# Patient Record
Sex: Male | Born: 1975 | Race: Black or African American | Hispanic: No | Marital: Married | Smoking: Current every day smoker
Health system: Southern US, Community
[De-identification: ages and names within clinical notes are randomized; demographics above are authoritative.]

## PROBLEM LIST (undated history)

## (undated) ENCOUNTER — Emergency Department: Discharge status: Absent without leave | Payer: Self-pay | Source: Home / Self Care

---

## 2007-06-17 ENCOUNTER — Other Ambulatory Visit: Payer: Self-pay

## 2007-06-17 ENCOUNTER — Emergency Department: Payer: Self-pay | Admitting: Emergency Medicine

## 2007-06-17 IMAGING — CT CT CHEST W/ CM
1 of 2 series · 14 of 32 positions shown, 18 images · IV contrast (APPLIED)
Comparison: none

REASON FOR EXAM: PLEURITIC RIGHT CP, ELEVATED DDIMER
COMMENTS:

[Series 5: lung windows · axial · 0.87mm/px · z∈[+206,+503]mm · 14 of 118 slices shown, 18 images]
[im 10/118  mediastinal]
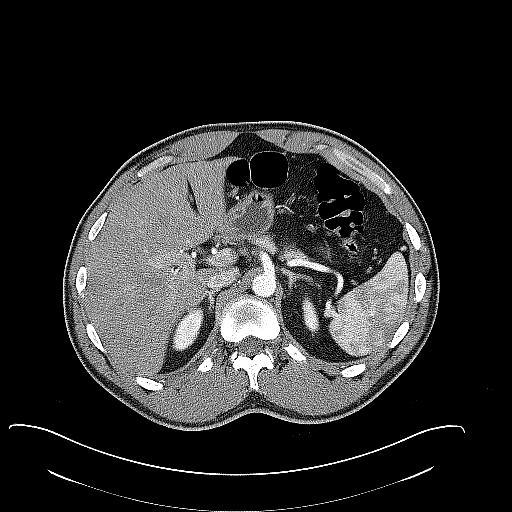
[im 10/118  lung]
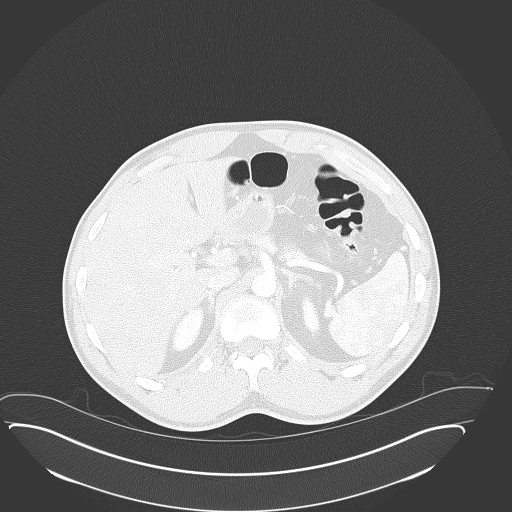
[im 19/118  lung]
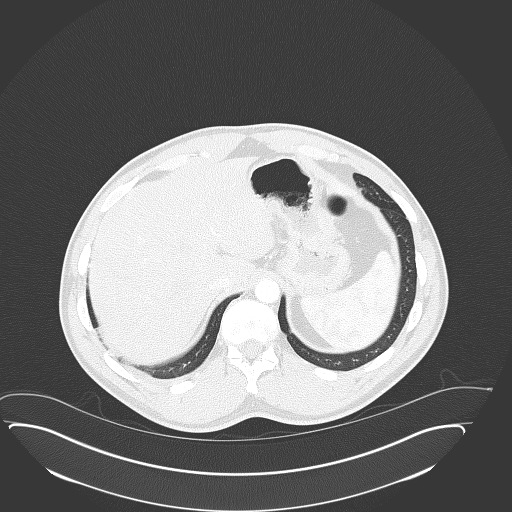
[im 28/118  lung]
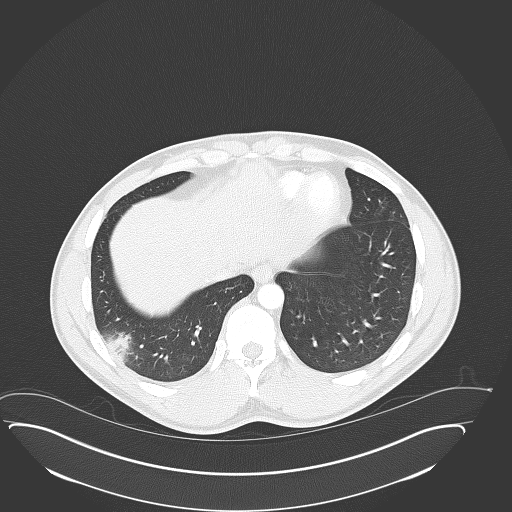
[im 37/118  lung]
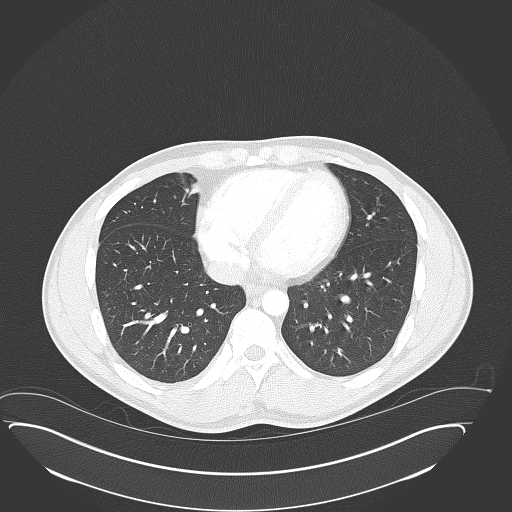
[im 46/118  mediastinal]
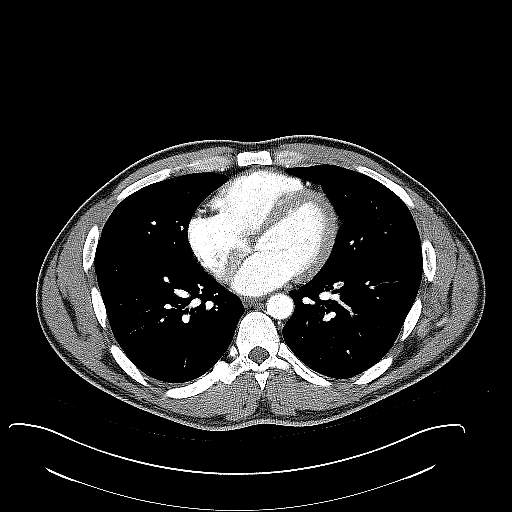
[im 46/118  lung]
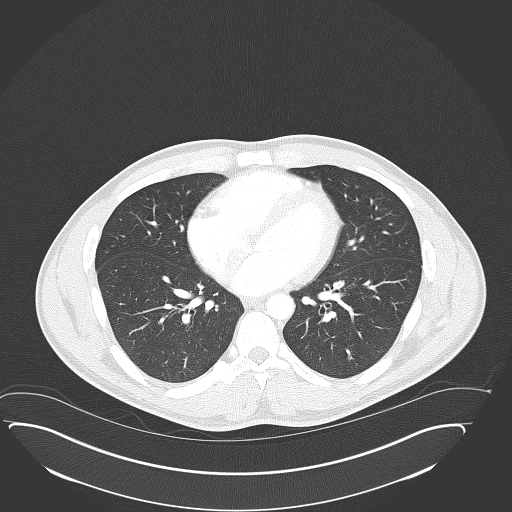
[im 55/118  lung]
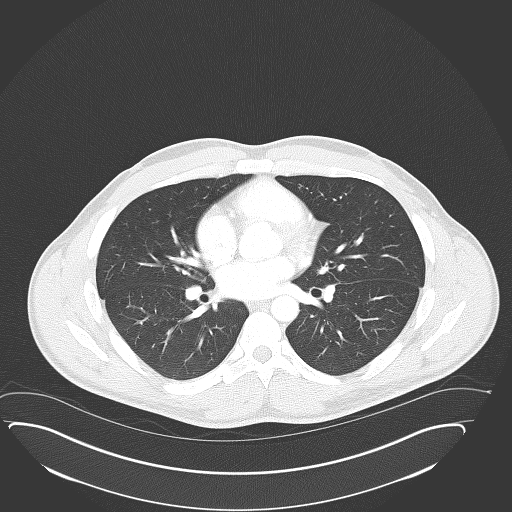
[im 56/118  lung]
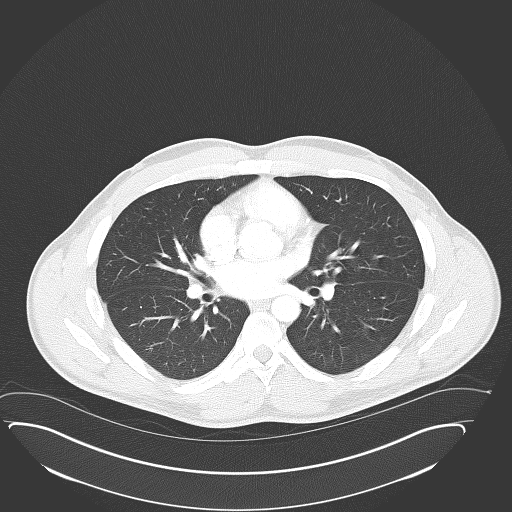
[im 59/118  lung]
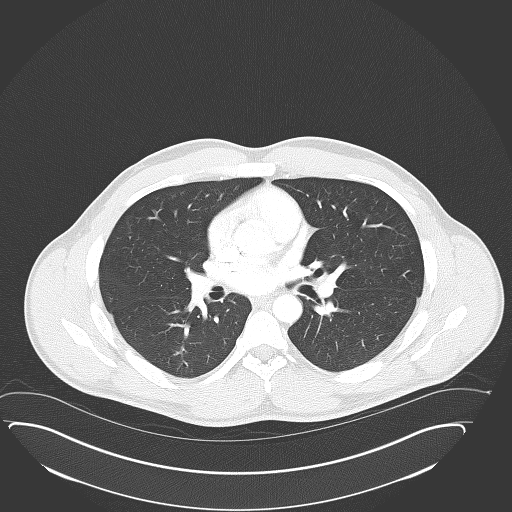
[im 64/118  mediastinal]
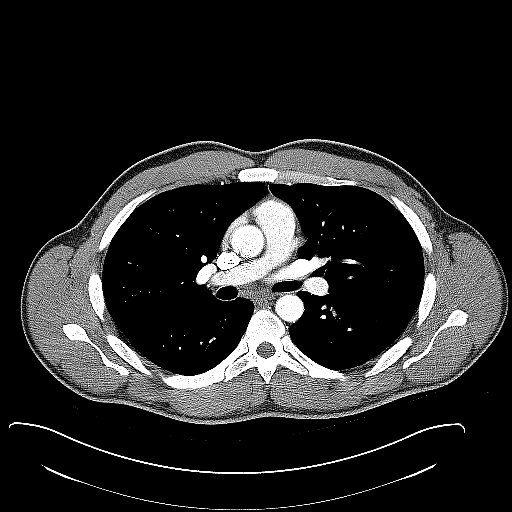
[im 64/118  lung]
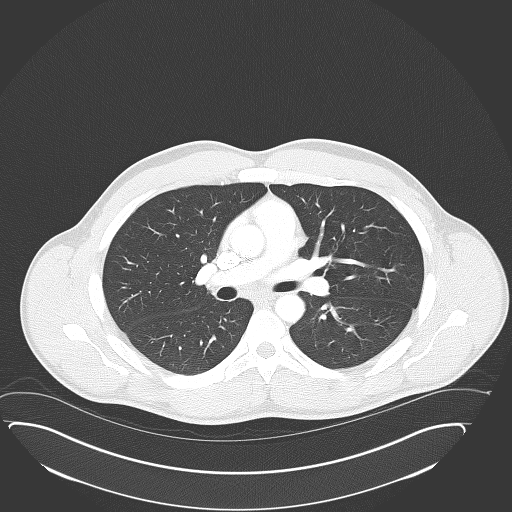
[im 73/118  lung]
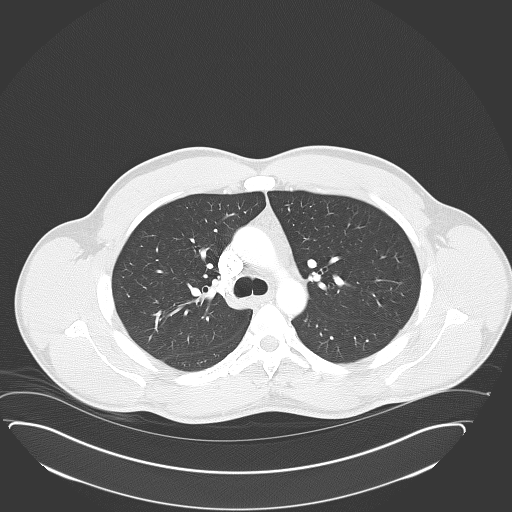
[im 82/118  lung]
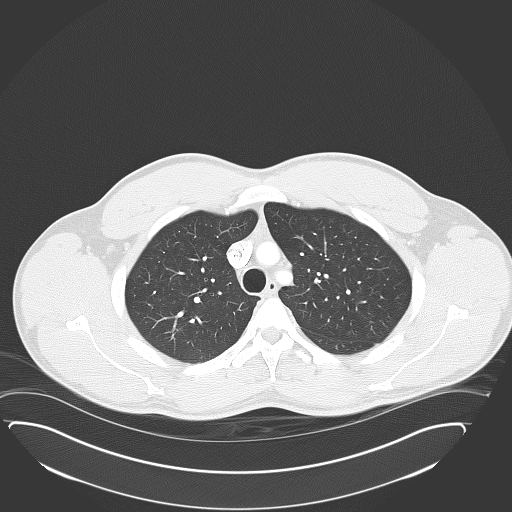
[im 91/118  lung]
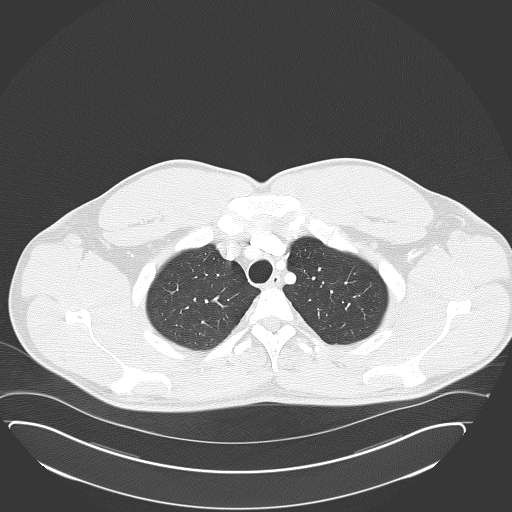
[im 100/118  mediastinal]
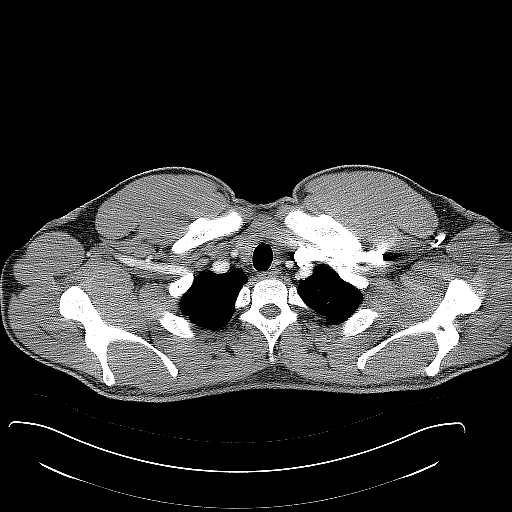
[im 100/118  lung]
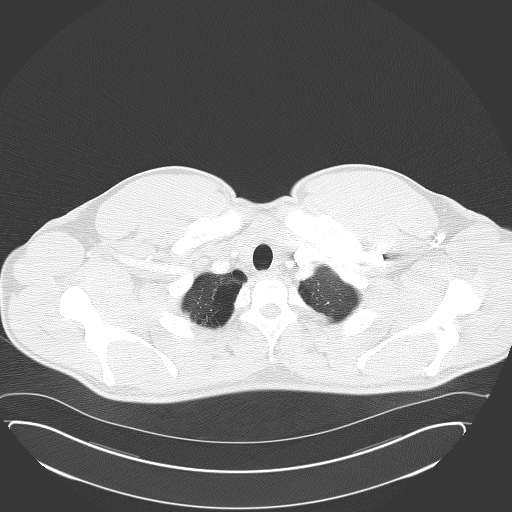
[im 109/118  lung]
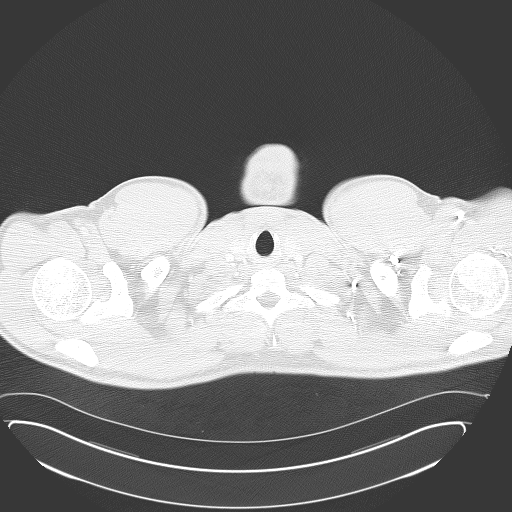

[14 of 32 positions shown; findings below may reference images not displayed]

PROCEDURE:     CT  - CT CHEST (FOR PE) W  - [DATE]  [DATE]

RESULT:     Helical 3 mm sections were obtained from the thoracic inlet
through the lung bases status post intravenous administration of 85 ml of
[KV].

Evaluation of the mediastinum, hilar regions and structures demonstrates no
evidence of mediastinal or hilar adenopathy or masses. There is no evidence
of filling defects within the main, lobar or segmental pulmonary arteries.
A very mild area of patchy increased density projects within the RIGHT lung
base. The lung parenchyma is otherwise unremarkable.  The visualized upper
abdominal viscera demonstrate no gross abnormalities.
IMPRESSION: 1.     No CT evidence of pulmonary arterial embolic disease.
2.     Atelectasis versus infiltrate RIGHT lung base.  Monitoring is
recommended status post appropriate therapeutic regimen.  If this area
persists, repeat evaluation with CT is recommended.
3.     Dr. JAYALEXIS of the Emergency Department was informed of these
findings at the time of the initial interpretation.

## 2007-06-17 IMAGING — CR DG CHEST 2V
1 series · 3 of 3 positions shown · non-contrast
Comparison: none

REASON FOR EXAM: CP
COMMENTS:

PROCEDURE:     DXR - DXR CHEST PA (OR AP) AND LATERAL  - [DATE]  [DATE]
RESULT:     The lungs are clear.  The cardiac silhouette and visualized bony
skeleton are unremarkable.

[Series 1: view not recorded · 0.17mm/px · 3 of 3 slices shown]
[im 1/3]
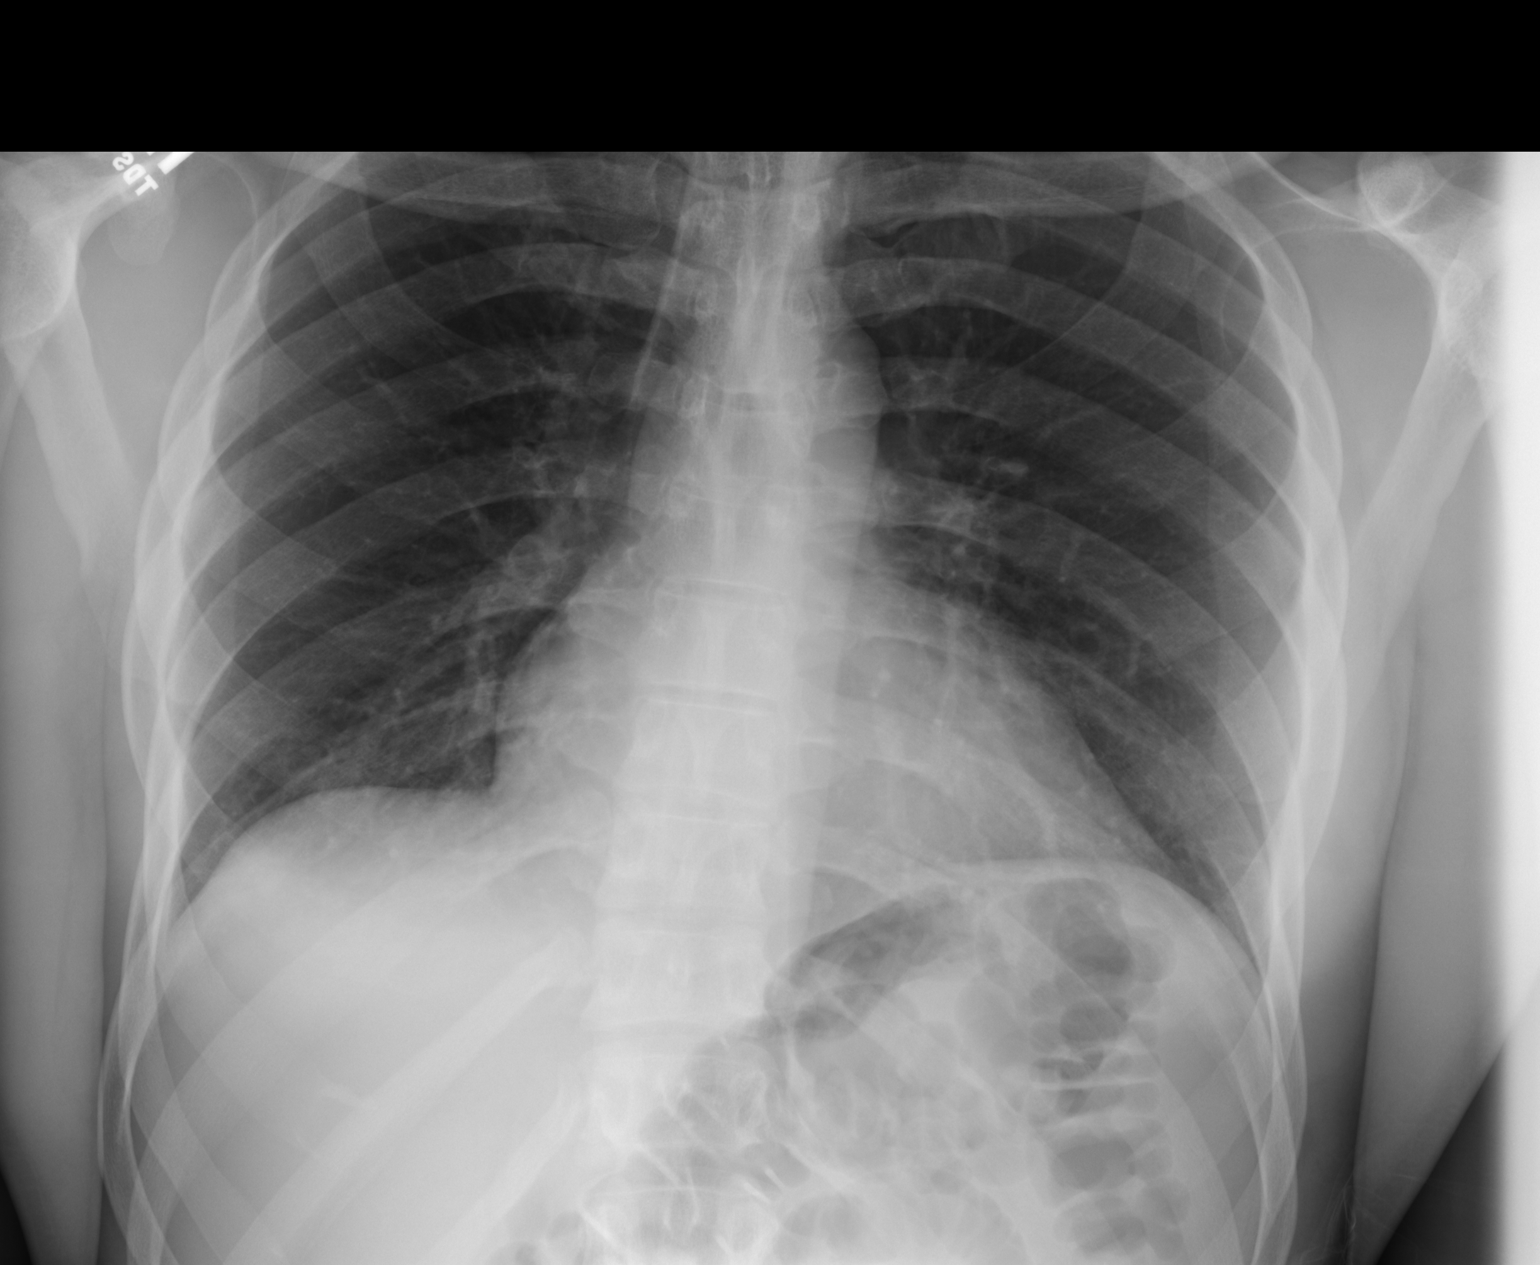
[im 2/3]
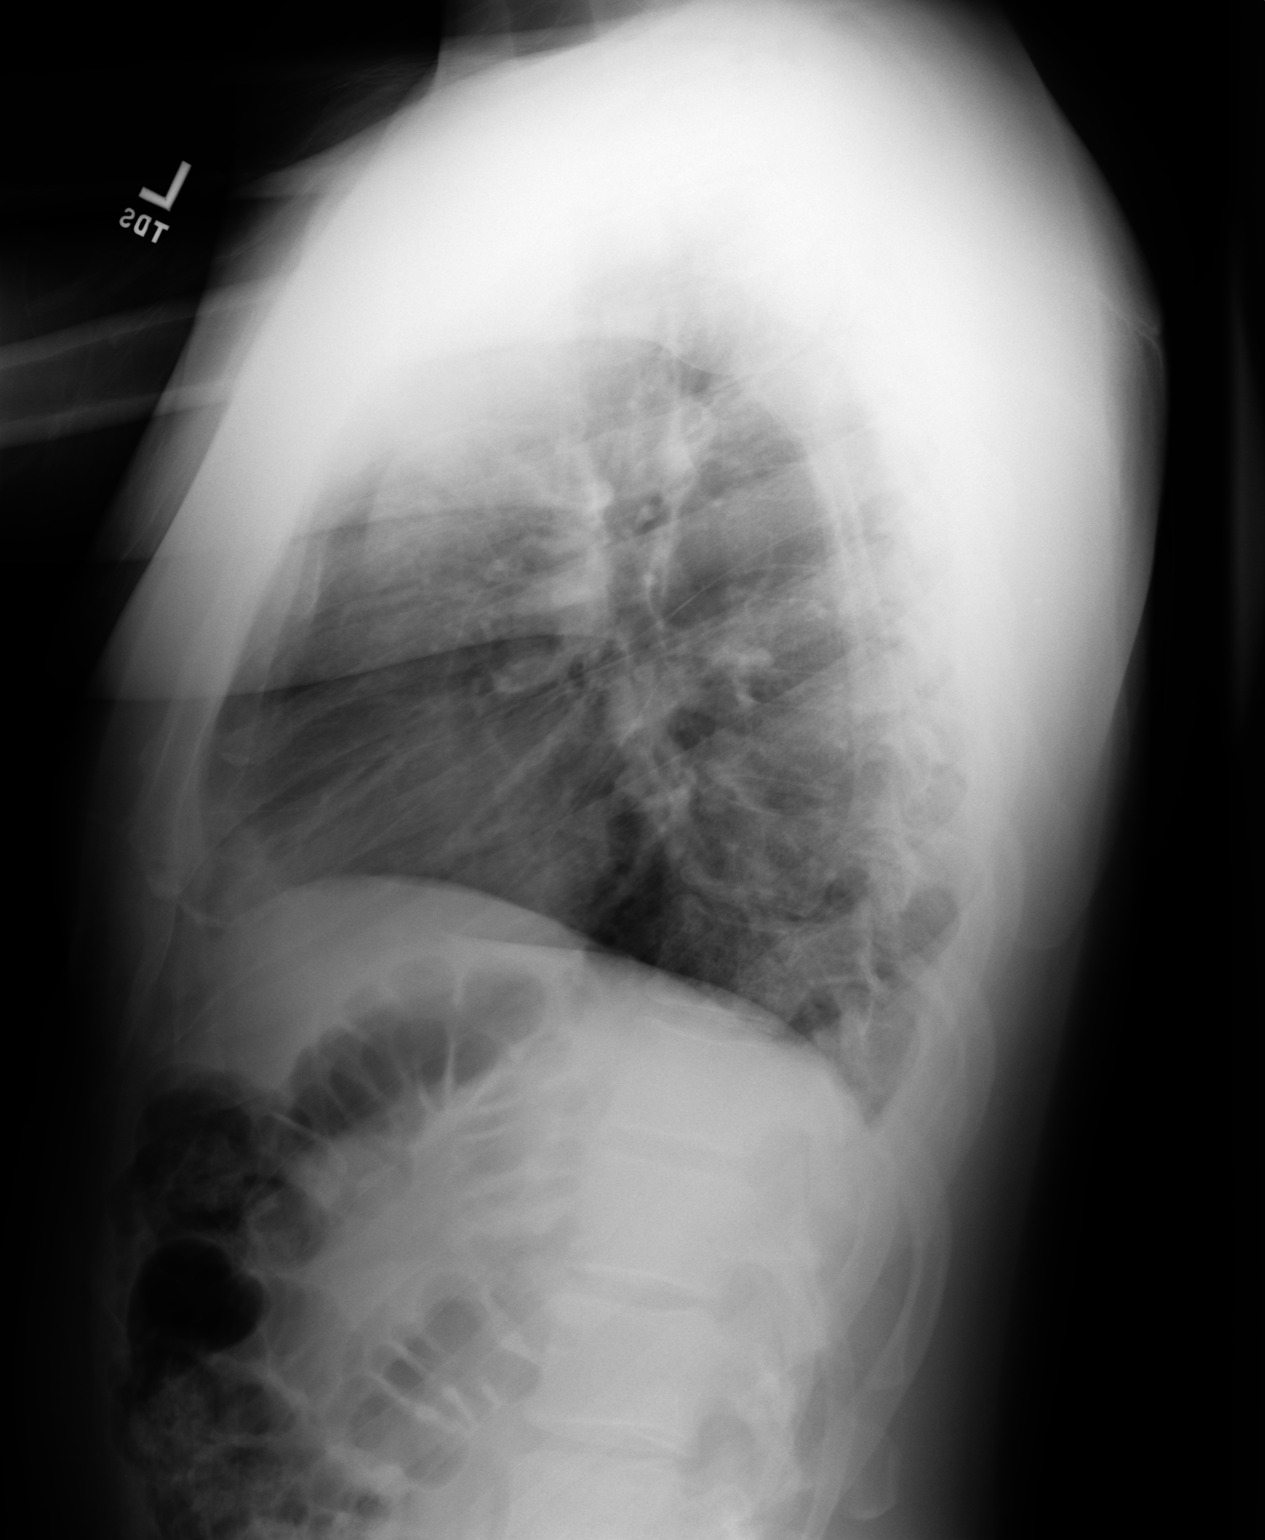
[im 3/3]
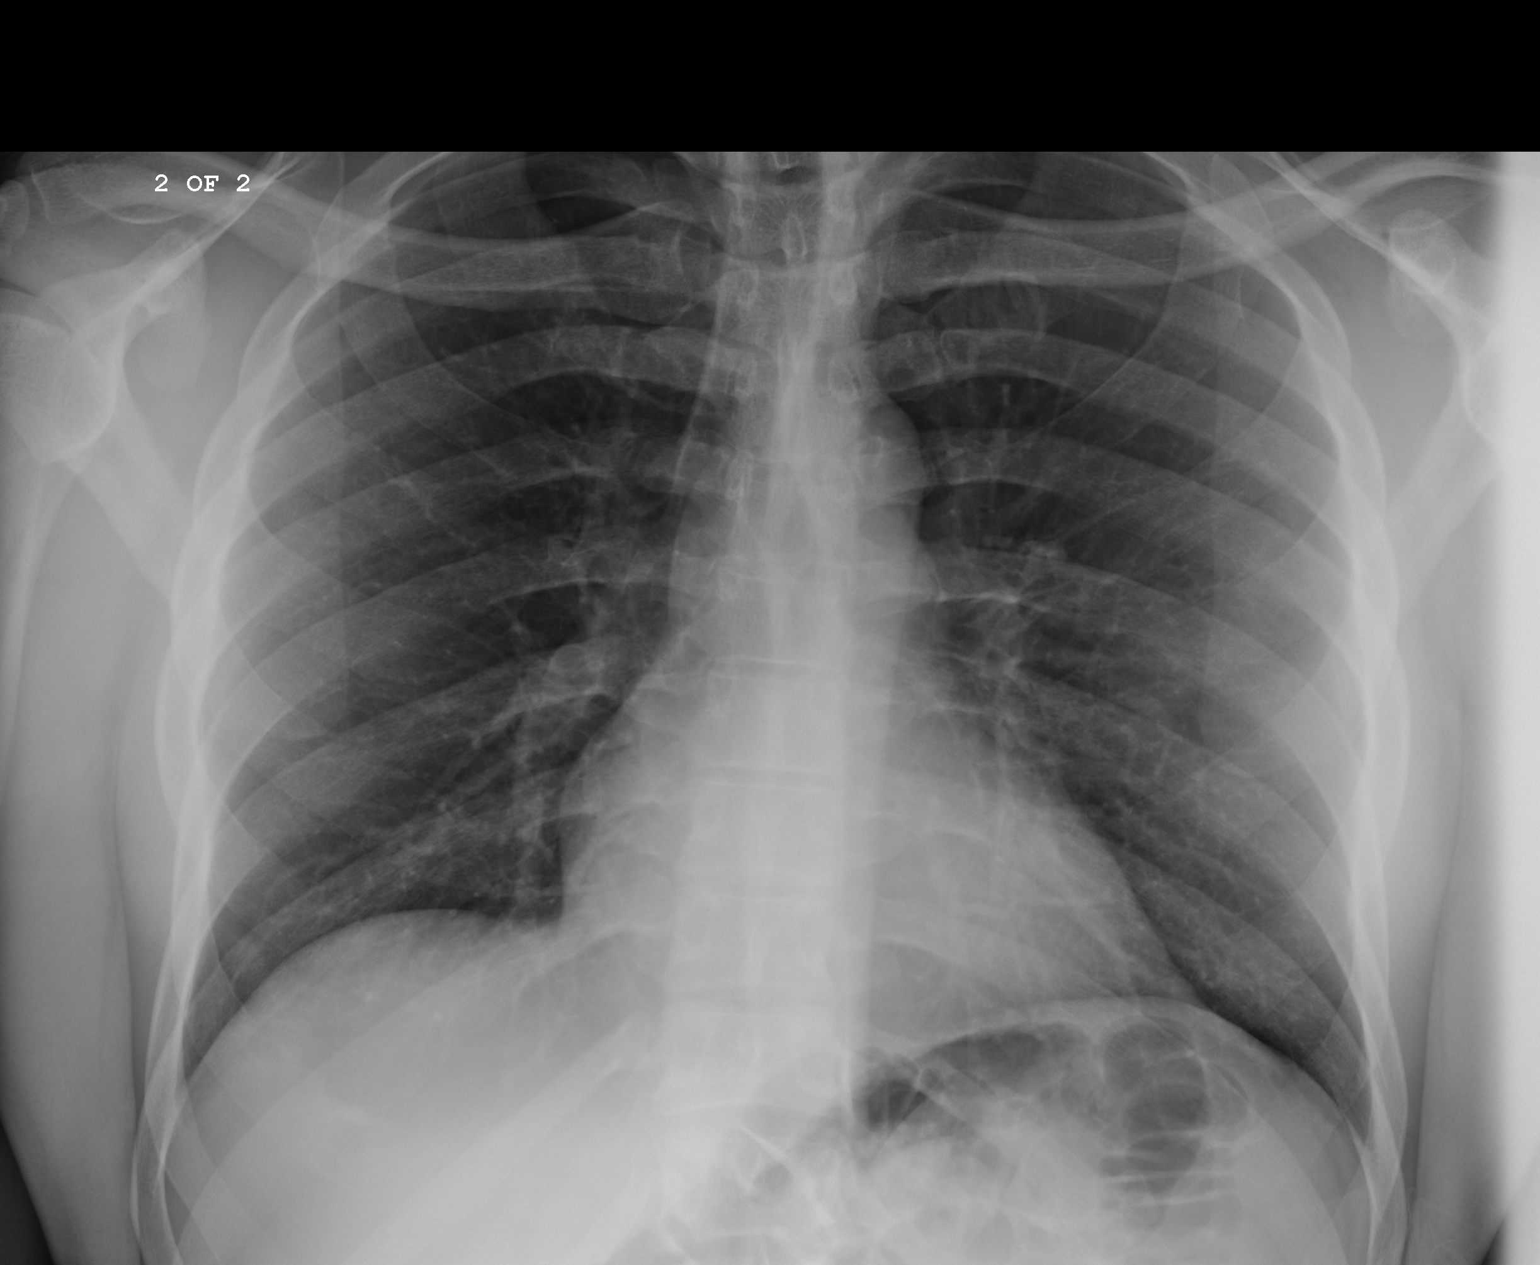

[3 of 3 positions shown; findings below may reference images not displayed]

IMPRESSION: Chest radiograph without evidence of acute cardiopulmonary disease.

## 2021-04-15 DIAGNOSIS — R0689 Other abnormalities of breathing: Secondary | ICD-10-CM | POA: Diagnosis not present

## 2021-04-15 DIAGNOSIS — T50904A Poisoning by unspecified drugs, medicaments and biological substances, undetermined, initial encounter: Secondary | ICD-10-CM | POA: Diagnosis not present

## 2021-04-15 DIAGNOSIS — T887XXA Unspecified adverse effect of drug or medicament, initial encounter: Secondary | ICD-10-CM | POA: Diagnosis not present

## 2021-04-15 DIAGNOSIS — R4020 Unspecified coma: Secondary | ICD-10-CM | POA: Diagnosis not present

## 2021-04-15 DIAGNOSIS — R404 Transient alteration of awareness: Secondary | ICD-10-CM | POA: Diagnosis not present

## 2021-10-31 ENCOUNTER — Emergency Department: Payer: 59

## 2021-10-31 ENCOUNTER — Other Ambulatory Visit: Payer: Self-pay

## 2021-10-31 ENCOUNTER — Emergency Department
Admission: EM | Admit: 2021-10-31 | Discharge: 2021-10-31 | Disposition: A | Discharge status: Home / Self Care | Payer: 59 | Attending: Emergency Medicine | Admitting: Emergency Medicine

## 2021-10-31 DIAGNOSIS — I639 Cerebral infarction, unspecified: Secondary | ICD-10-CM | POA: Diagnosis not present

## 2021-10-31 DIAGNOSIS — G43409 Hemiplegic migraine, not intractable, without status migrainosus: Secondary | ICD-10-CM | POA: Diagnosis not present

## 2021-10-31 DIAGNOSIS — G43809 Other migraine, not intractable, without status migrainosus: Secondary | ICD-10-CM | POA: Diagnosis not present

## 2021-10-31 DIAGNOSIS — J45909 Unspecified asthma, uncomplicated: Secondary | ICD-10-CM | POA: Insufficient documentation

## 2021-10-31 DIAGNOSIS — Y900 Blood alcohol level of less than 20 mg/100 ml: Secondary | ICD-10-CM | POA: Diagnosis not present

## 2021-10-31 DIAGNOSIS — I10 Essential (primary) hypertension: Secondary | ICD-10-CM | POA: Diagnosis not present

## 2021-10-31 DIAGNOSIS — R0689 Other abnormalities of breathing: Secondary | ICD-10-CM | POA: Diagnosis not present

## 2021-10-31 DIAGNOSIS — Z743 Need for continuous supervision: Secondary | ICD-10-CM | POA: Diagnosis not present

## 2021-10-31 DIAGNOSIS — R531 Weakness: Secondary | ICD-10-CM | POA: Diagnosis not present

## 2021-10-31 DIAGNOSIS — R69 Illness, unspecified: Secondary | ICD-10-CM | POA: Diagnosis not present

## 2021-10-31 DIAGNOSIS — R55 Syncope and collapse: Secondary | ICD-10-CM | POA: Diagnosis not present

## 2021-10-31 DIAGNOSIS — R29818 Other symptoms and signs involving the nervous system: Secondary | ICD-10-CM | POA: Diagnosis not present

## 2021-10-31 DIAGNOSIS — R4020 Unspecified coma: Secondary | ICD-10-CM | POA: Diagnosis not present

## 2021-10-31 LAB — DIFFERENTIAL
Abs Immature Granulocytes: 0.03 10*3/uL (ref 0.00–0.07)
Basophils Absolute: 0 10*3/uL (ref 0.0–0.1)
Basophils Relative: 1 %
Eosinophils Absolute: 0.1 10*3/uL (ref 0.0–0.5)
Eosinophils Relative: 1 %
Immature Granulocytes: 0 %
Lymphocytes Relative: 23 %
Lymphs Abs: 2 10*3/uL (ref 0.7–4.0)
Monocytes Absolute: 0.5 10*3/uL (ref 0.1–1.0)
Monocytes Relative: 6 %
Neutro Abs: 6 10*3/uL (ref 1.7–7.7)
Neutrophils Relative %: 69 %

## 2021-10-31 LAB — COMPREHENSIVE METABOLIC PANEL
ALT: 13 U/L (ref 0–44)
AST: 13 U/L — ABNORMAL LOW (ref 15–41)
Albumin: 3.6 g/dL (ref 3.5–5.0)
Alkaline Phosphatase: 63 U/L (ref 38–126)
Anion gap: 5 (ref 5–15)
BUN: 13 mg/dL (ref 6–20)
CO2: 26 mmol/L (ref 22–32)
Calcium: 8.7 mg/dL — ABNORMAL LOW (ref 8.9–10.3)
Chloride: 111 mmol/L (ref 98–111)
Creatinine, Ser: 0.98 mg/dL (ref 0.61–1.24)
GFR, Estimated: >60 mL/min (ref >60)
Glucose, Bld: 92 mg/dL (ref 70–99)
Potassium: 3.9 mmol/L (ref 3.5–5.1)
Sodium: 142 mmol/L (ref 135–145)
Total Bilirubin: 0.7 mg/dL (ref 0.3–1.2)
Total Protein: 6.4 g/dL — ABNORMAL LOW (ref 6.5–8.1)

## 2021-10-31 LAB — PROTIME-INR
INR: 1 (ref 0.8–1.2)
Prothrombin Time: 12.9 seconds (ref 11.4–15.2)

## 2021-10-31 LAB — ETHANOL: Alcohol, Ethyl (B): <10 mg/dL (ref <10)

## 2021-10-31 LAB — CBC
HCT: 45.3 % (ref 39.0–52.0)
Hemoglobin: 14.3 g/dL (ref 13.0–17.0)
MCH: 28.1 pg (ref 26.0–34.0)
MCHC: 31.6 g/dL (ref 30.0–36.0)
MCV: 89.2 fL (ref 80.0–100.0)
Platelets: 215 10*3/uL (ref 150–400)
RBC: 5.08 MIL/uL (ref 4.22–5.81)
RDW: 13.8 % (ref 11.5–15.5)
WBC: 8.7 10*3/uL (ref 4.0–10.5)
nRBC: 0 % (ref 0.0–0.2)

## 2021-10-31 LAB — APTT: aPTT: 32 seconds (ref 24–36)

## 2021-10-31 LAB — CBG MONITORING, ED: Glucose-Capillary: 87 mg/dL (ref 70–99)

## 2021-10-31 IMAGING — CT CT HEAD CODE STROKE
3 of 4 series · 13 of 47 positions shown, 15 images · non-contrast
Comparison: No pertinent prior exams available for comparison.

CLINICAL DATA: Code stroke. Provided history: Neuro deficit, acute,
stroke suspected.

EXAM:
CT ANGIOGRAPHY HEAD AND NECK
TECHNIQUE: Multidetector CT imaging of the head and neck was performed using
the standard protocol during bolus administration of intravenous
contrast. Multiplanar CT image reconstructions and MIPs were
obtained to evaluate the vascular anatomy. Carotid stenosis
measurements (when applicable) are obtained utilizing NASCET
criteria, using the distal internal carotid diameter as the
denominator.

[Series 4: head wo · axial · 0.45mm/px · z∈[-109,+11]mm · 7 of 32 slices shown, 9 images]
[im 4/32  brain]
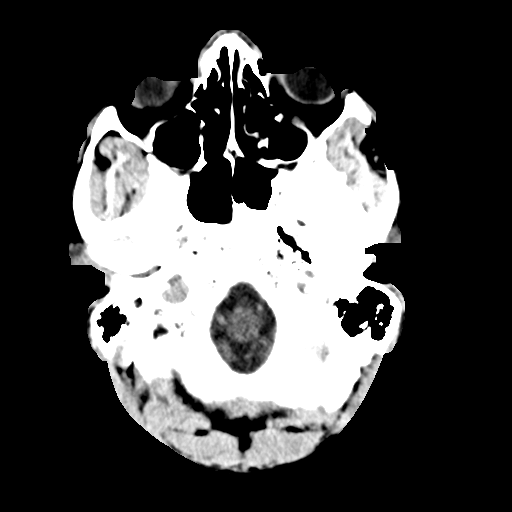
[im 4/32  bone]
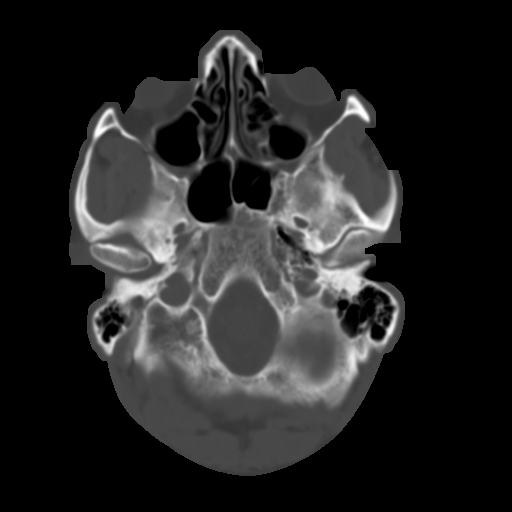
[im 8/32  brain]
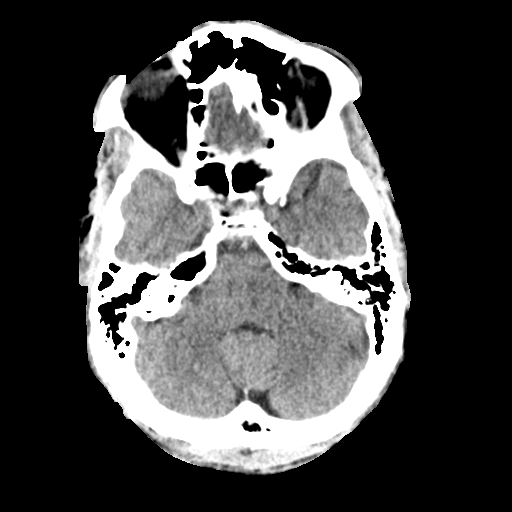
[im 12/32  brain]
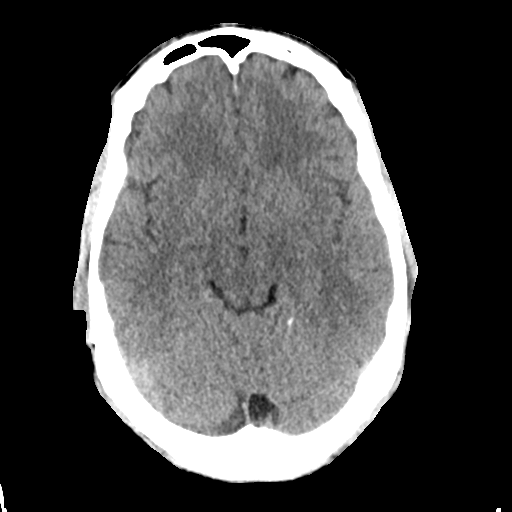
[im 16/32  brain]
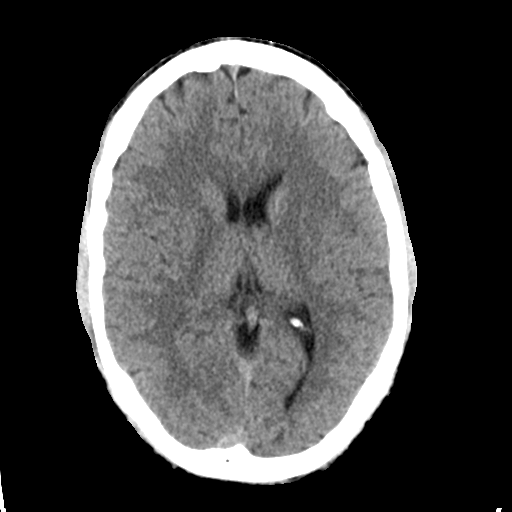
[im 20/32  brain]
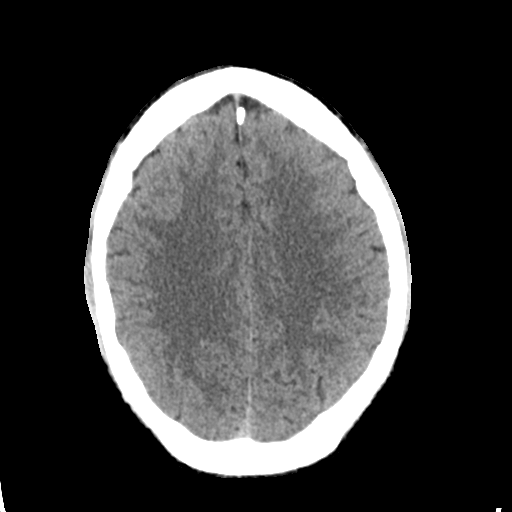
[im 20/32  bone]
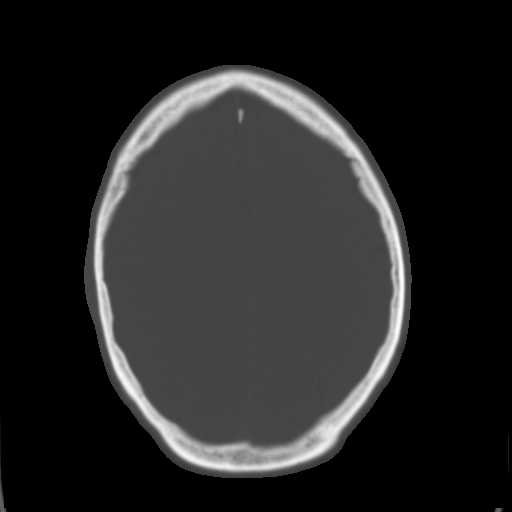
[im 24/32  brain]
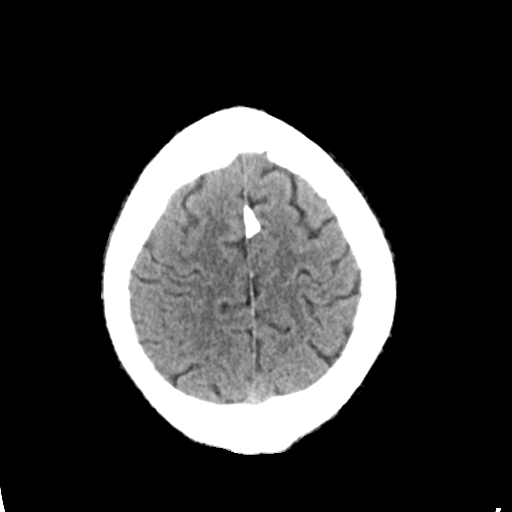
[im 28/32  brain]
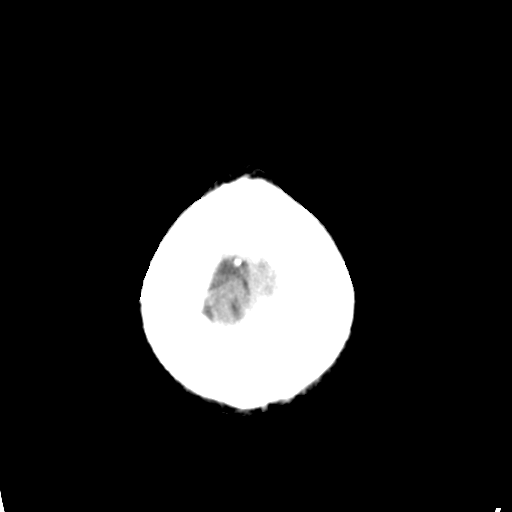

[Series 6: coronal soft tissue · coronal · 0.32mm/px · 3 of 73 slices shown]
[im 25/73  brain]
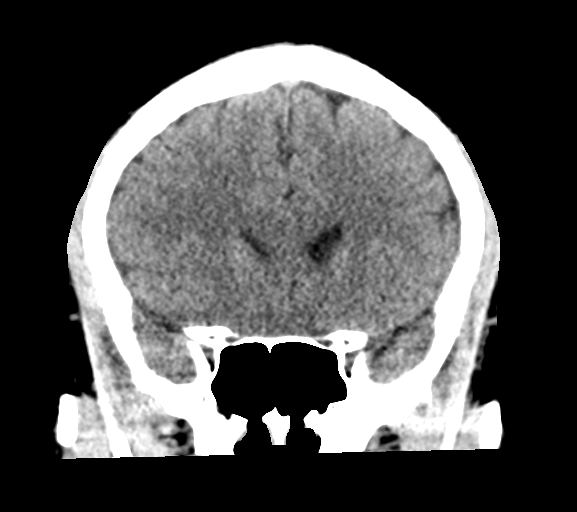
[im 33/73  brain]
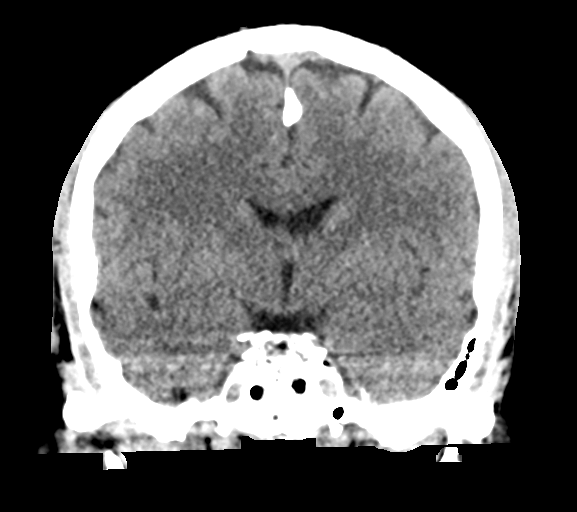
[im 41/73  brain]
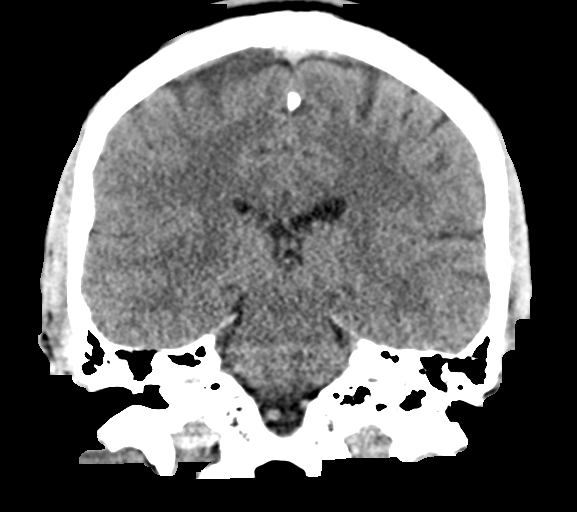

[Series 7: sagittal soft tissue · sagittal · 0.32mm/px · 3 of 63 slices shown]
[im 21/63  brain]
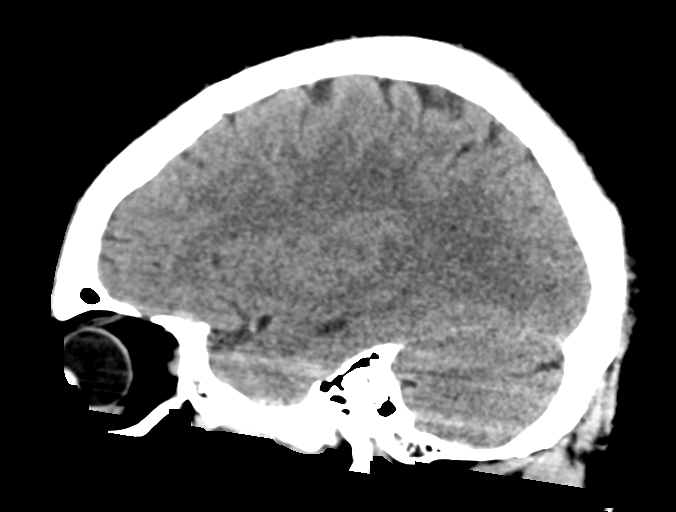
[im 32/63  brain]
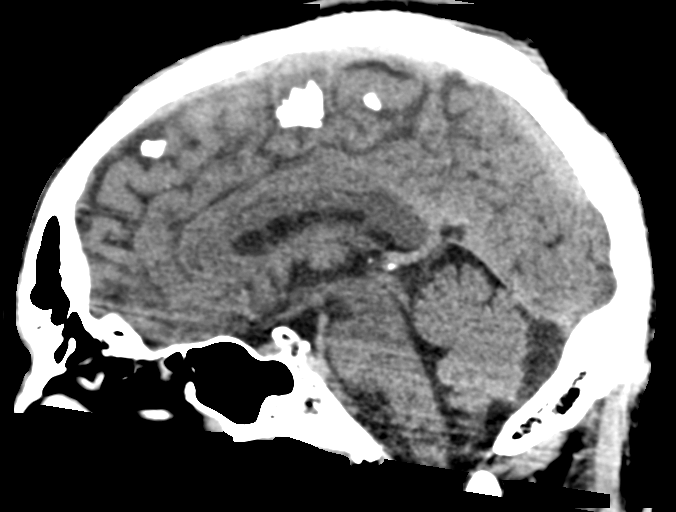
[im 42/63  brain]
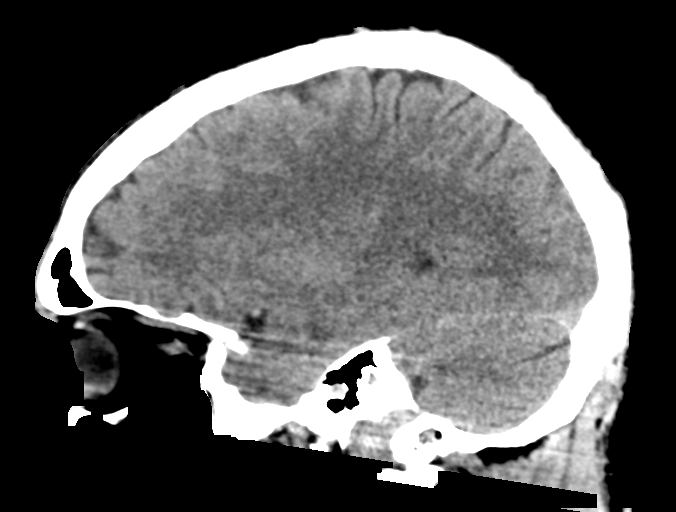

[13 of 47 positions shown; findings below may reference images not displayed]

RADIATION DOSE REDUCTION: This exam was performed according to the
departmental dose-optimization program which includes automated
exposure control, adjustment of the mA and/or kV according to
patient size and/or use of iterative reconstruction technique.

CONTRAST:  Administered contrast not known at this time.
FINDINGS: CT HEAD FINDINGS

Brain:

Cerebral volume is normal.

There is no acute intracranial hemorrhage.

No demarcated cortical infarct.

No extra-axial fluid collection.

No evidence of an intracranial mass.

No midline shift.

Vascular: No hyperdense vessel.

Skull: No fracture or aggressive osseous lesion.

Sinuses/Orbits: No mass or acute finding within the imaged orbits.
Trace mucosal thickening within the bilateral maxillary sinuses at
the imaged levels.

ASPECTS (Alberta Stroke Program Early CT Score)

- Ganglionic level infarction (caudate, lentiform nuclei, internal
capsule, insula, M1-M3 cortex): 7

- Supraganglionic infarction (M4-M6 cortex): 3

Total score (0-10 with 10 being normal): 10

These results were communicated to Dr. STUMAN at [DATE] STUMAN
[DATE]by text page via the [employer] messaging system.

Review of the MIP images confirms the above findings

CTA NECK FINDINGS

Suboptimal arterial contrast bolus/timing.

Aortic arch: Common origin of the innominate and left common carotid
arteries. Streak and beam hardening artifact arising from a dense
right-sided contrast bolus partially obscures the right subclavian
artery. Within this limitation, there is no appreciable
hemodynamically significant innominate or proximal subclavian artery
stenosis.

Right carotid system: CCA and ICA patent within the neck without
stenosis or significant atherosclerotic disease.

Left carotid system: CCA and ICA patent within the neck without
stenosis or significant atherosclerotic disease.

Vertebral arteries: Vertebral arteries codominant and patent within
the neck without appreciable hemodynamically significant stenosis.

Skeleton: Reversal the expected cervical lordosis. No acute fracture
or aggressive osseous lesion.

Other neck: No neck mass or cervical lymphadenopathy.

Upper chest: No consolidation within the imaged lung apices.
Biapical paraseptal emphysema/blebs.

Review of the MIP images confirms the above findings

CTA HEAD FINDINGS

Examination limited by suboptimal arterial contrast bolus/timing.
Most notably, this limits evaluation at the level of the M2 and more
distal MCA vessels.

Anterior circulation:

The intracranial internal carotid arteries are patent. The M1 middle
cerebral arteries are patent. No definite M2 proximal branch
occlusion or high-grade proximal stenosis. The right anterior
cerebral artery is poorly delineated at the A2 and more distal
levels. This may be due to developmentally small vessel size.
However, vessel occlusion at this site cannot be excluded. No
intracranial aneurysm is identified.

Posterior circulation:

The intracranial vertebral arteries are patent. The basilar artery
is patent. The posterior cerebral arteries are patent. Posterior
communicating arteries are diminutive or absent, bilaterally.

Venous sinuses: Within the limitations of contrast timing, no
convincing thrombus.

Anatomic variants: As described.

Review of the MIP images confirms the above findings

These results were called by telephone at the time of interpretation
on [DATE] at [DATE] to provider Dr. STUMAN, who verbally
acknowledged these results.
IMPRESSION: CT head:

No evidence of acute intracranial abnormality.

CTA neck:

1. Suboptimal arterial contrast bolus/timing.
2. The common carotid and internal carotid arteries are patent
within the neck without stenosis or significant atherosclerotic
disease.
3. Vertebral arteries patent within the neck without appreciable
hemodynamically significant stenosis.

CTA head:

1. Examination limited by suboptimal arterial contrast bolus/timing.
Most notably, this limits evaluation at the level of the M2 and more
distal MCA vessels.
2. The right anterior cerebral artery is poorly delineated at the A2
and more distal levels. This may be due to developmentally small
vessel size and the poor contrast bolus/timing. However, vessel
occlusion at this site cannot be excluded. Correlate with findings
on pending MRA head.
3. Within described limitations, there is no convincing evidence of
proximal large vessel occlusion elsewhere.

## 2021-10-31 IMAGING — MR MR HEAD W/O CM
8 series · 48 of 48 positions shown · non-contrast
Comparison: None Available.

CLINICAL DATA: Stroke, follow up; Neuro deficit, acute, stroke
suspected; left-sided weakness

EXAM:
MRI HEAD WITHOUT CONTRAST
MRA HEAD WITHOUT CONTRAST
TECHNIQUE: Multiplanar, multi-echo pulse sequences of the brain and surrounding
structures were acquired without intravenous contrast. Angiographic
images of the Circle of Willis were acquired using MRA technique
without intravenous contrast.

[Series 5: ax dwi_tracew · axial · 3.0mm · 0.71mm/px · z∈[-114,+46]mm · 6 of 56 slices shown]
[im 1/56]
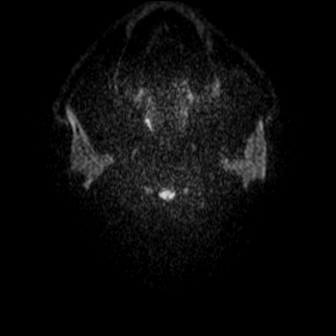
[im 12/56]
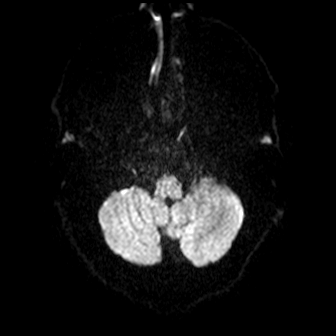
[im 23/56]
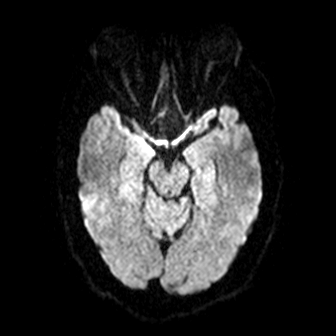
[im 34/56]
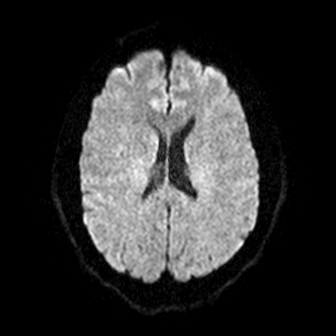
[im 45/56]
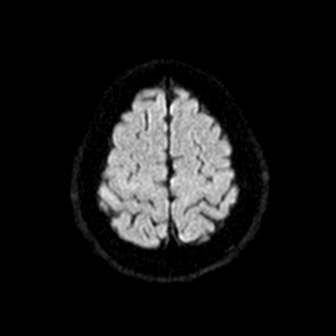
[im 56/56]
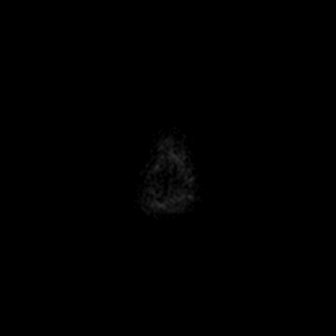

[Series 6: ax dwi_adc · axial · 3.0mm · 0.71mm/px · z∈[-114,+46]mm · 7 of 56 slices shown]
[im 1/56]
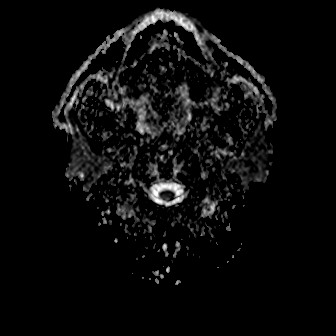
[im 10/56]
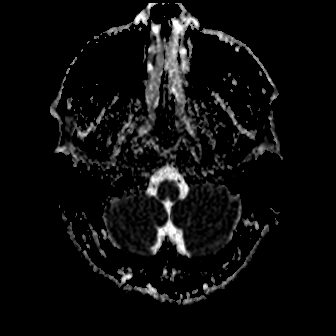
[im 19/56]
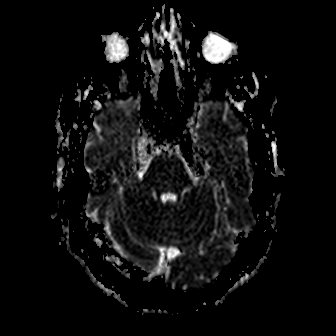
[im 28/56]
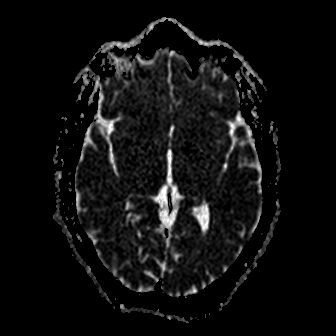
[im 37/56]
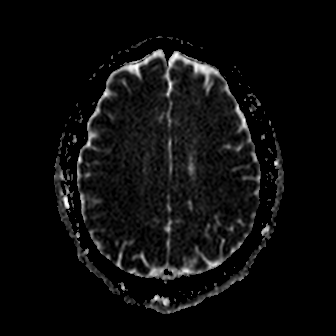
[im 46/56]
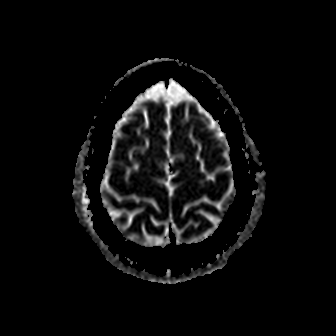
[im 56/56]
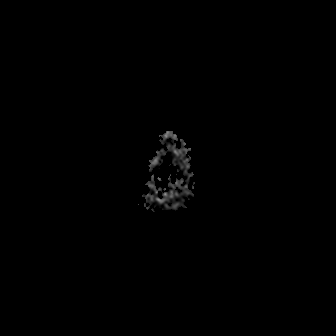

[Series 7: cor dwi_tracew · coronal · 5.0mm · 0.68mm/px · 5 of 40 slices shown]
[im 1/40]
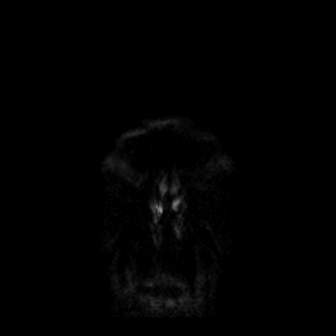
[im 10/40]
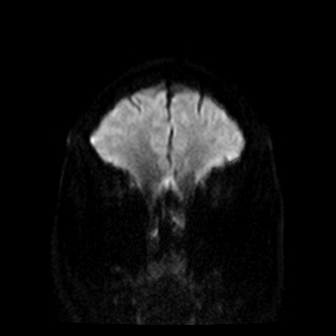
[im 20/40]
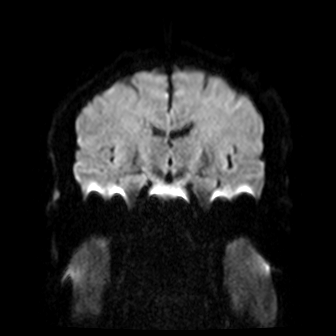
[im 30/40]
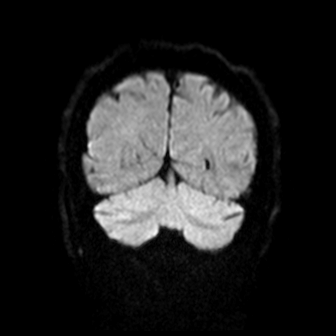
[im 40/40]
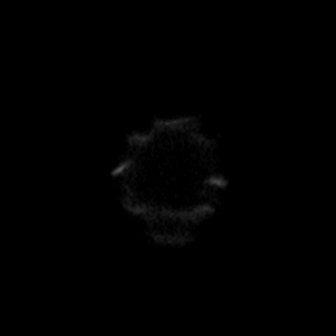

[Series 8: cor dwi_adc · coronal · 5.0mm · 0.68mm/px · 5 of 40 slices shown]
[im 1/40]
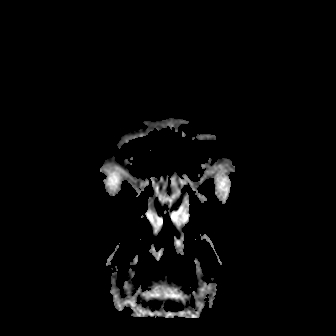
[im 10/40]
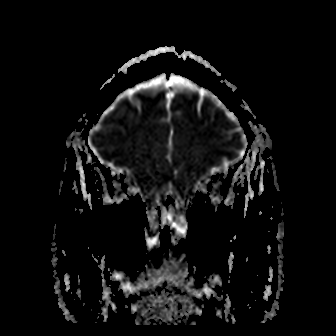
[im 20/40]
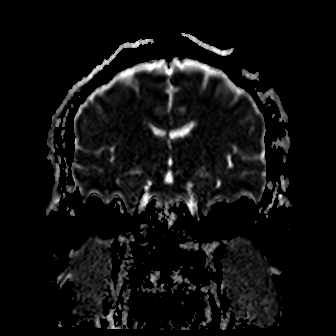
[im 30/40]
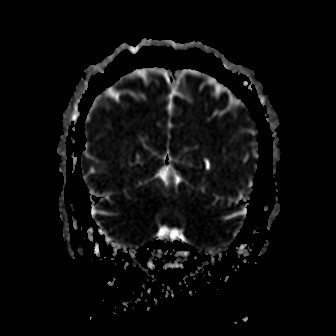
[im 40/40]
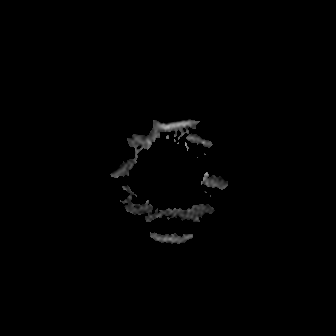

[Series 9: FLAIR · axial · 3.0mm · 0.69mm/px · z∈[-110,+47]mm · 7 of 55 slices shown]
[im 1/55]
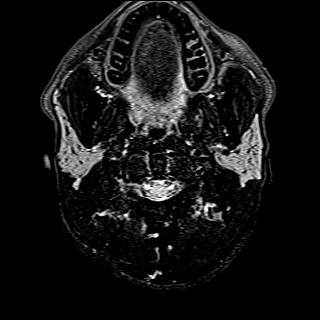
[im 10/55]
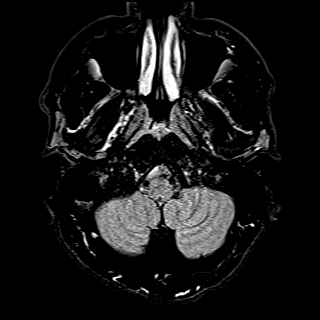
[im 19/55]
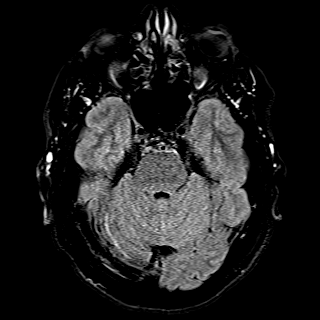
[im 28/55]
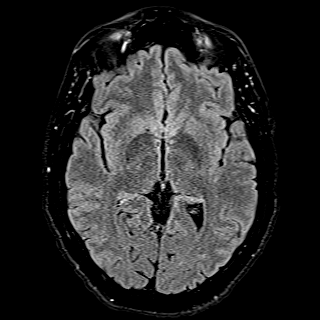
[im 37/55]
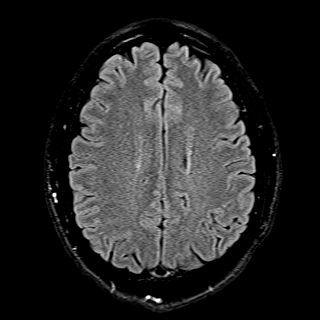
[im 46/55]
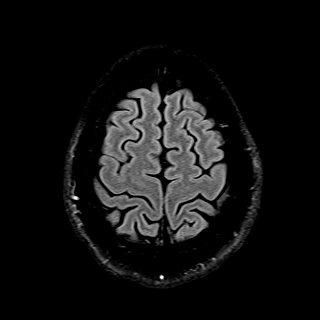
[im 55/55]
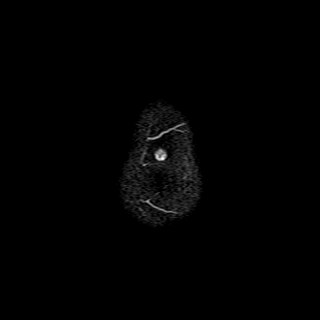

[Series 15: ax swi_mag · axial · 3.0mm · 0.90mm/px · z∈[-107,+42]mm · 6 of 52 slices shown]
[im 1/52]
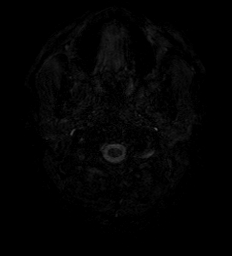
[im 11/52]
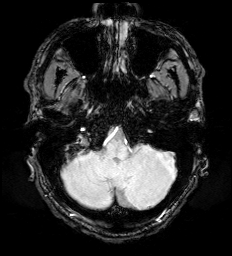
[im 21/52]
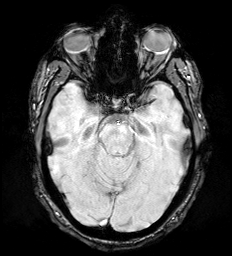
[im 31/52]
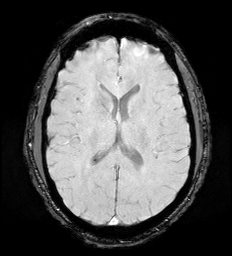
[im 41/52]
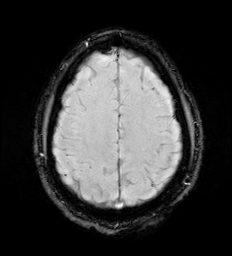
[im 52/52]
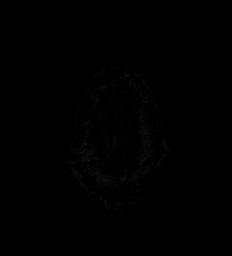

[Series 16: ax swi_pha · axial · 3.0mm · 0.90mm/px · z∈[-107,+42]mm · 6 of 52 slices shown]
[im 1/52]
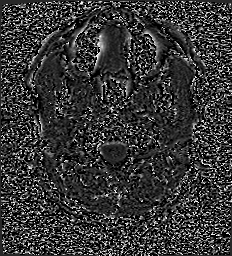
[im 11/52]
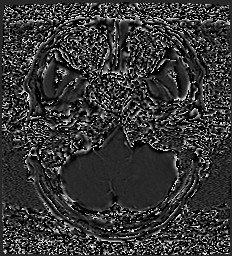
[im 21/52]
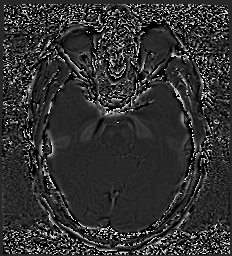
[im 31/52]
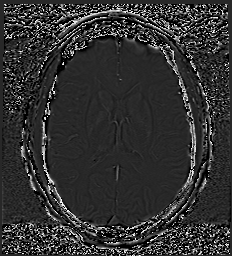
[im 41/52]
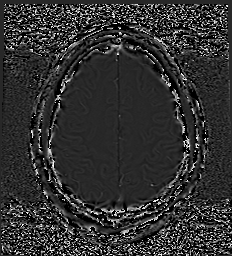
[im 52/52]
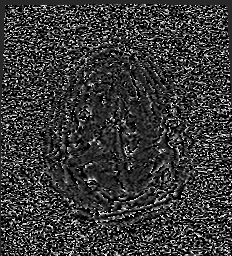

[Series 17: ax swi_swi · axial · 3.0mm · 0.90mm/px · z∈[-107,+42]mm · 6 of 52 slices shown]
[im 1/52]
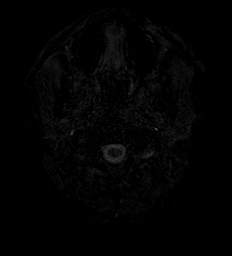
[im 11/52]
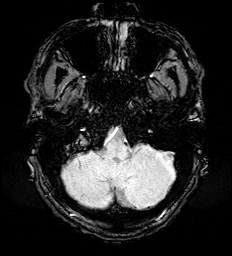
[im 21/52]
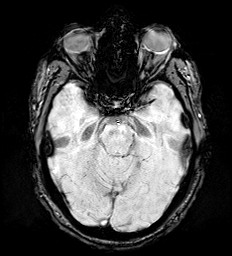
[im 31/52]
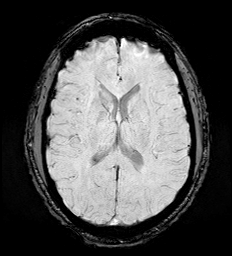
[im 41/52]
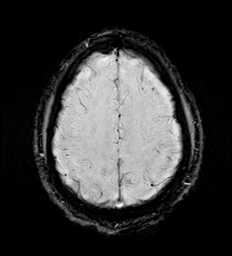
[im 52/52]
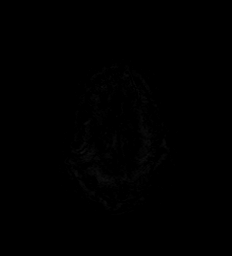

[48 of 48 positions shown; findings below may reference images not displayed]

FINDINGS: MRI HEAD

DWI, T2 FLAIR, and SWI sequences only.

Brain: There is no acute infarction or intracranial hemorrhage.
There is no intracranial mass, mass effect, or edema. There is no
hydrocephalus or extra-axial fluid collection. Ventricles and sulci
are normal in size and configuration.

Vascular: Major vessel flow voids at the skull base are preserved.

Skull and upper cervical spine: Normal marrow signal is preserved.

Sinuses/Orbits: Paranasal sinuses are aerated. Orbits are
unremarkable.

Other: Sella is unremarkable.  Mastoid air cells are clear.

MRA HEAD

Intracranial internal carotid arteries are patent. Middle and
anterior cerebral arteries are patent. Intracranial vertebral
arteries, basilar artery, posterior cerebral arteries are patent.
There is no significant stenosis or aneurysm.
IMPRESSION: No acute infarction or hemorrhage. No large vessel occlusion or
significant stenosis.

## 2021-10-31 IMAGING — CT CT ANGIO HEAD-NECK (W OR W/O PERF)
3 of 7 series · 10 of 36 positions shown · IV contrast (APPLIED)
Comparison: No pertinent prior exams available for comparison.

CLINICAL DATA: Code stroke. Provided history: Neuro deficit, acute,
stroke suspected.

EXAM:
CT ANGIOGRAPHY HEAD AND NECK
TECHNIQUE: Multidetector CT imaging of the head and neck was performed using
the standard protocol during bolus administration of intravenous
contrast. Multiplanar CT image reconstructions and MIPs were
obtained to evaluate the vascular anatomy. Carotid stenosis
measurements (when applicable) are obtained utilizing NASCET
criteria, using the distal internal carotid diameter as the
denominator.

[Series 4: cta head neck · axial · 0.46mm/px · z∈[-236,-114]mm · 2 of 185 slices shown]
[im 62/185  soft-tissue]
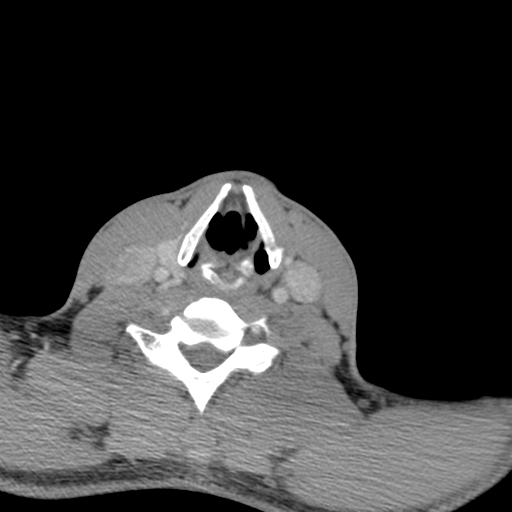
[im 123/185  soft-tissue]
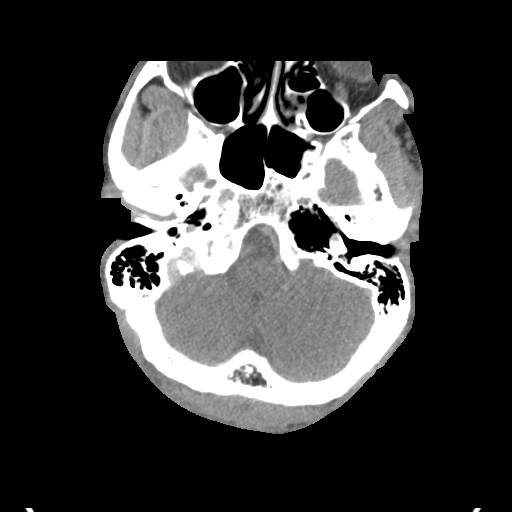

[Series 6: ax thin · axial · 0.49mm/px · z∈[-325,-57]mm · 6 of 396 slices shown]
[im 57/396  soft-tissue]
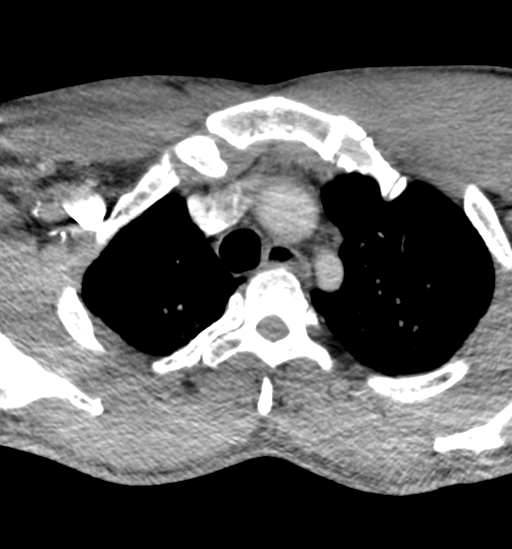
[im 113/396  bone]
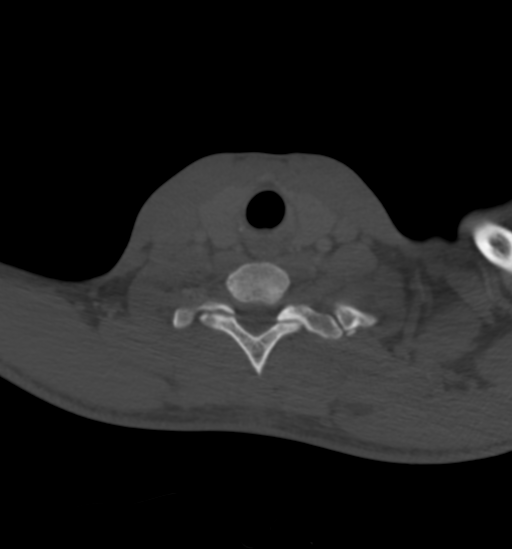
[im 170/396  soft-tissue]
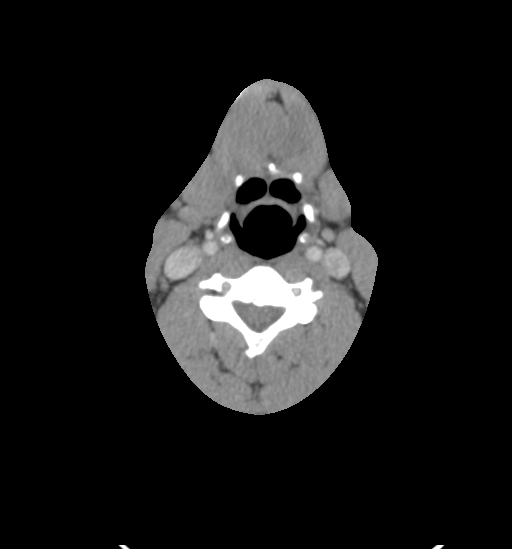
[im 226/396  bone]
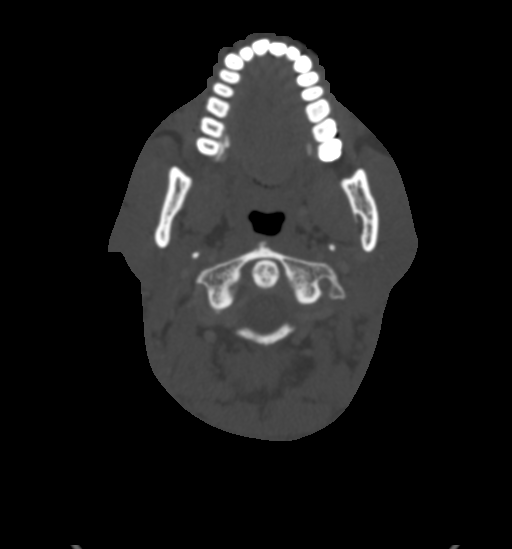
[im 283/396  soft-tissue]
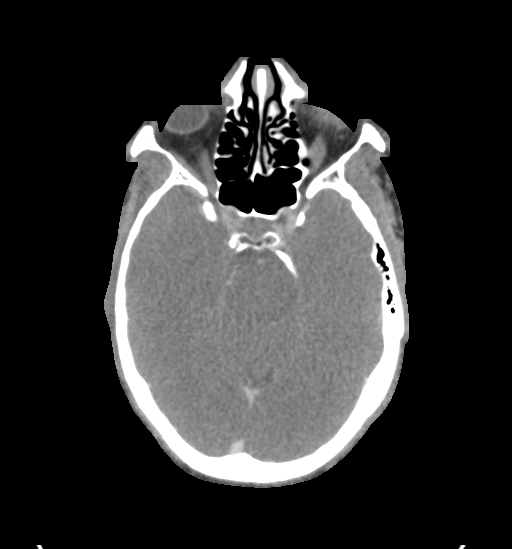
[im 339/396  bone]
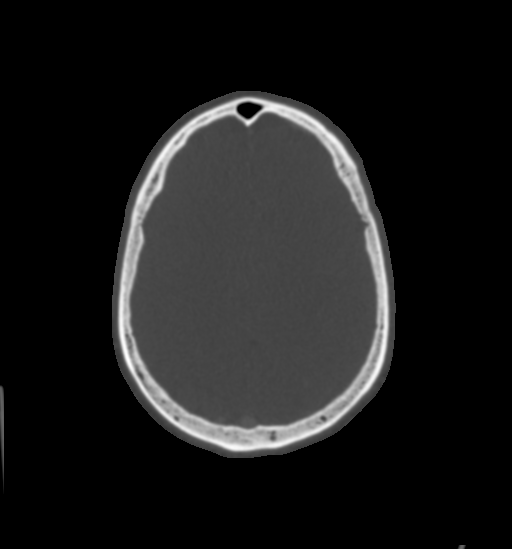

[Series 8: sagittal thin · sagittal · 0.58mm/px · 2 of 191 slices shown]
[im 101/191  soft-tissue]
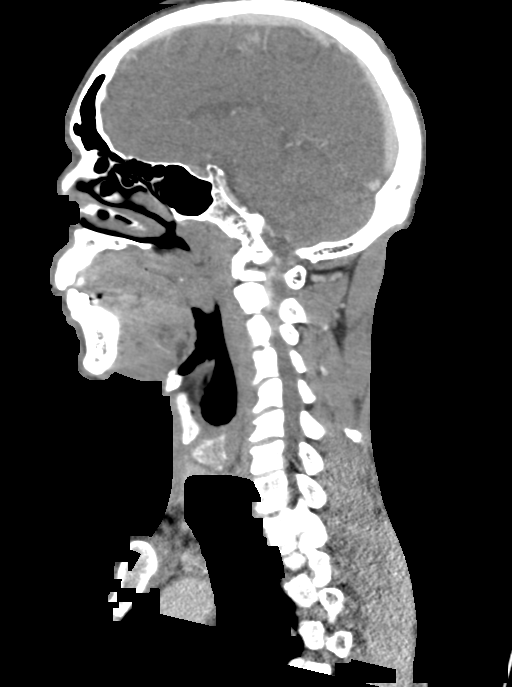
[im 174/191  soft-tissue]
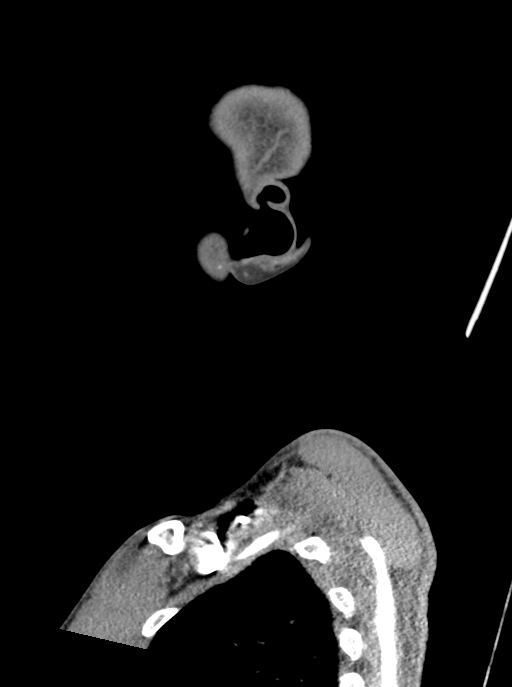

[10 of 36 positions shown; findings below may reference images not displayed]

RADIATION DOSE REDUCTION: This exam was performed according to the
departmental dose-optimization program which includes automated
exposure control, adjustment of the mA and/or kV according to
patient size and/or use of iterative reconstruction technique.

CONTRAST:  Administered contrast not known at this time.
FINDINGS: CT HEAD FINDINGS

Brain:

Cerebral volume is normal.

There is no acute intracranial hemorrhage.

No demarcated cortical infarct.

No extra-axial fluid collection.

No evidence of an intracranial mass.

No midline shift.

Vascular: No hyperdense vessel.

Skull: No fracture or aggressive osseous lesion.

Sinuses/Orbits: No mass or acute finding within the imaged orbits.
Trace mucosal thickening within the bilateral maxillary sinuses at
the imaged levels.

ASPECTS (Alberta Stroke Program Early CT Score)

- Ganglionic level infarction (caudate, lentiform nuclei, internal
capsule, insula, M1-M3 cortex): 7

- Supraganglionic infarction (M4-M6 cortex): 3

Total score (0-10 with 10 being normal): 10

These results were communicated to Dr. STUMAN at [DATE] STUMAN
[DATE]by text page via the [employer] messaging system.

Review of the MIP images confirms the above findings

CTA NECK FINDINGS

Suboptimal arterial contrast bolus/timing.

Aortic arch: Common origin of the innominate and left common carotid
arteries. Streak and beam hardening artifact arising from a dense
right-sided contrast bolus partially obscures the right subclavian
artery. Within this limitation, there is no appreciable
hemodynamically significant innominate or proximal subclavian artery
stenosis.

Right carotid system: CCA and ICA patent within the neck without
stenosis or significant atherosclerotic disease.

Left carotid system: CCA and ICA patent within the neck without
stenosis or significant atherosclerotic disease.

Vertebral arteries: Vertebral arteries codominant and patent within
the neck without appreciable hemodynamically significant stenosis.

Skeleton: Reversal the expected cervical lordosis. No acute fracture
or aggressive osseous lesion.

Other neck: No neck mass or cervical lymphadenopathy.

Upper chest: No consolidation within the imaged lung apices.
Biapical paraseptal emphysema/blebs.

Review of the MIP images confirms the above findings

CTA HEAD FINDINGS

Examination limited by suboptimal arterial contrast bolus/timing.
Most notably, this limits evaluation at the level of the M2 and more
distal MCA vessels.

Anterior circulation:

The intracranial internal carotid arteries are patent. The M1 middle
cerebral arteries are patent. No definite M2 proximal branch
occlusion or high-grade proximal stenosis. The right anterior
cerebral artery is poorly delineated at the A2 and more distal
levels. This may be due to developmentally small vessel size.
However, vessel occlusion at this site cannot be excluded. No
intracranial aneurysm is identified.

Posterior circulation:

The intracranial vertebral arteries are patent. The basilar artery
is patent. The posterior cerebral arteries are patent. Posterior
communicating arteries are diminutive or absent, bilaterally.

Venous sinuses: Within the limitations of contrast timing, no
convincing thrombus.

Anatomic variants: As described.

Review of the MIP images confirms the above findings

These results were called by telephone at the time of interpretation
on [DATE] at [DATE] to provider Dr. STUMAN, who verbally
acknowledged these results.
IMPRESSION: CT head:

No evidence of acute intracranial abnormality.

CTA neck:

1. Suboptimal arterial contrast bolus/timing.
2. The common carotid and internal carotid arteries are patent
within the neck without stenosis or significant atherosclerotic
disease.
3. Vertebral arteries patent within the neck without appreciable
hemodynamically significant stenosis.

CTA head:

1. Examination limited by suboptimal arterial contrast bolus/timing.
Most notably, this limits evaluation at the level of the M2 and more
distal MCA vessels.
2. The right anterior cerebral artery is poorly delineated at the A2
and more distal levels. This may be due to developmentally small
vessel size and the poor contrast bolus/timing. However, vessel
occlusion at this site cannot be excluded. Correlate with findings
on pending MRA head.
3. Within described limitations, there is no convincing evidence of
proximal large vessel occlusion elsewhere.

## 2021-10-31 IMAGING — MR MR MRA HEAD W/O CM
1 series · 27 of 48 positions shown · non-contrast
Comparison: None Available.

CLINICAL DATA: Stroke, follow up; Neuro deficit, acute, stroke
suspected; left-sided weakness

EXAM:
MRI HEAD WITHOUT CONTRAST
MRA HEAD WITHOUT CONTRAST
TECHNIQUE: Multiplanar, multi-echo pulse sequences of the brain and surrounding
structures were acquired without intravenous contrast. Angiographic
images of the Circle of Willis were acquired using MRA technique
without intravenous contrast.

[Series 10: TOF · axial · 0.5mm · 0.48mm/px · z∈[-101,-7]mm · 27 of 217 slices shown]
[im 1/217]
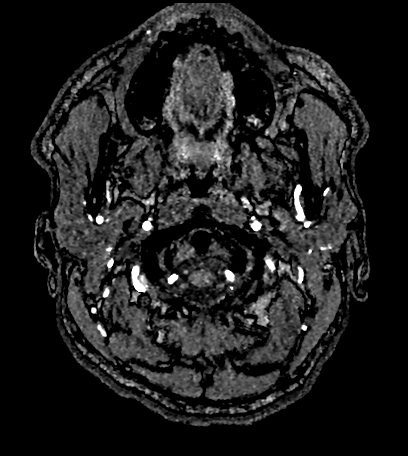
[im 5/217]
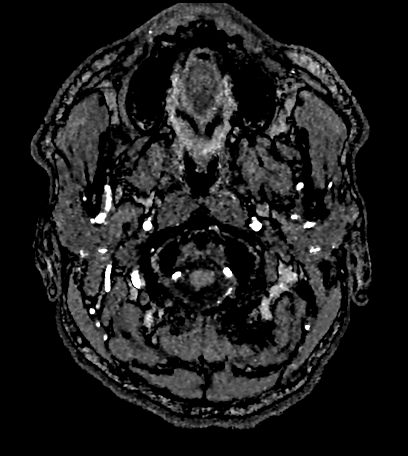
[im 10/217]
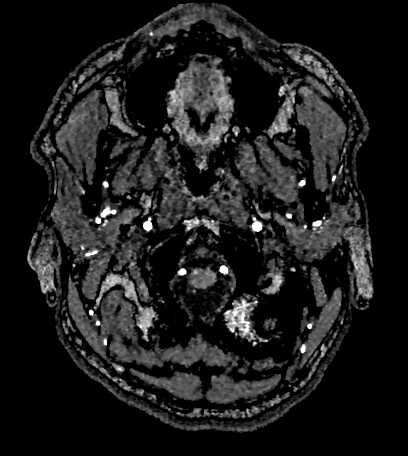
[im 14/217]
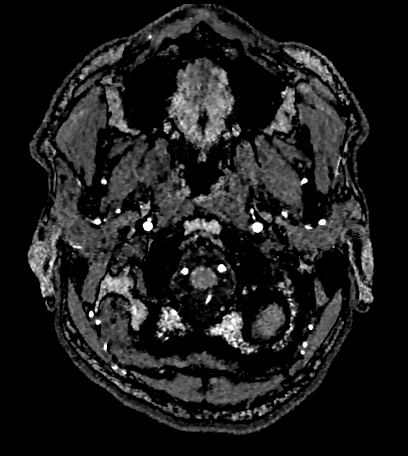
[im 19/217]
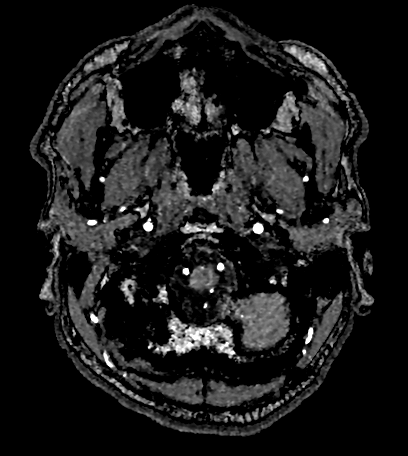
[im 23/217]
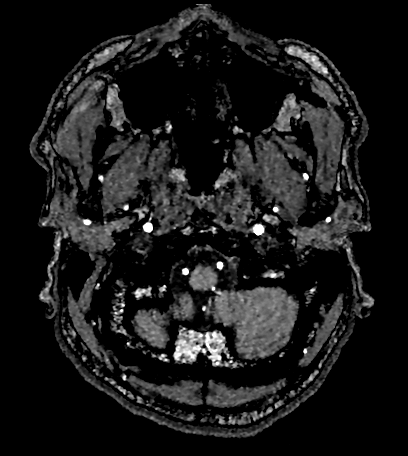
[im 28/217]
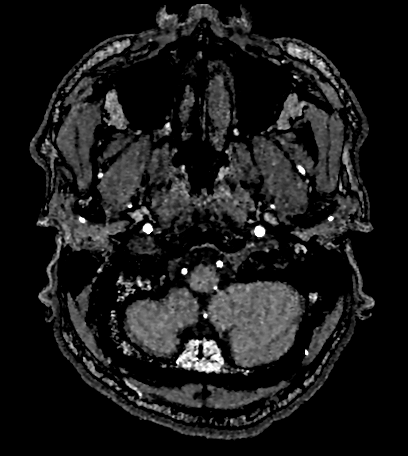
[im 33/217]
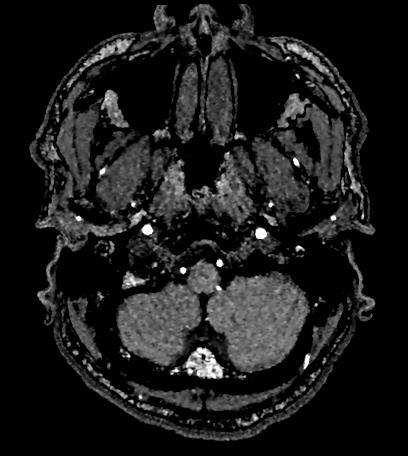
[im 37/217]
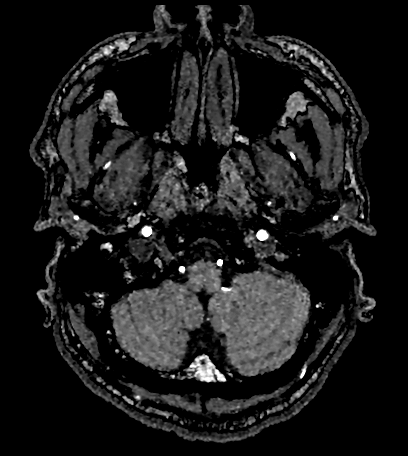
[im 42/217]
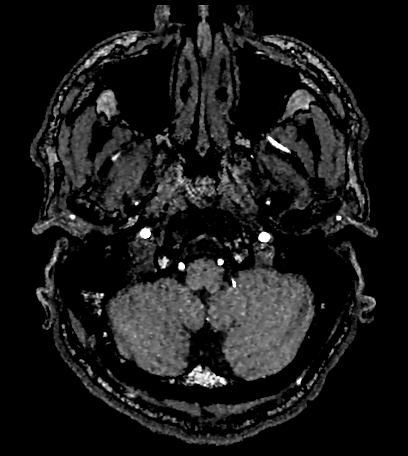
[im 46/217]
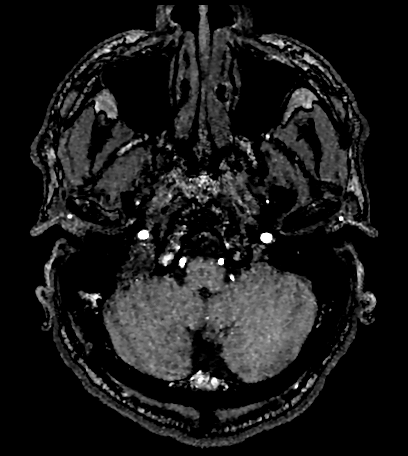
[im 51/217]
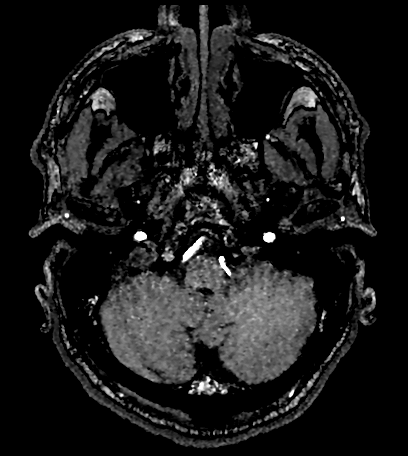
[im 56/217]
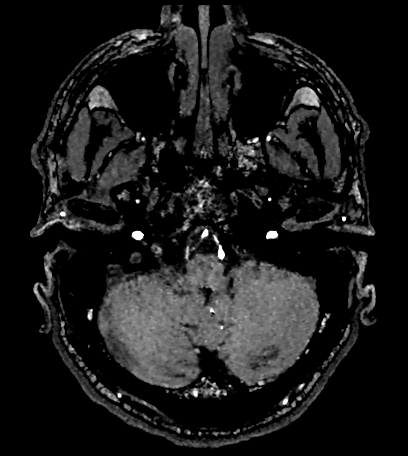
[im 60/217]
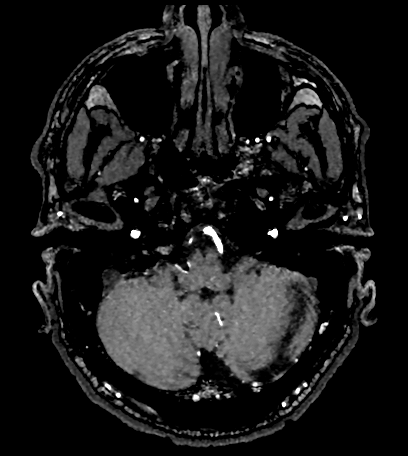
[im 65/217]
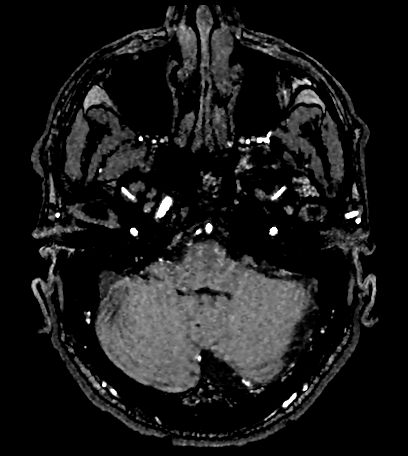
[im 69/217]
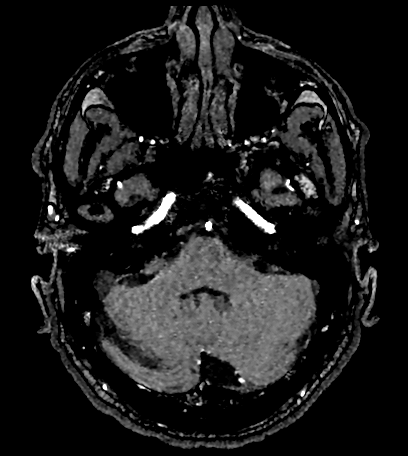
[im 74/217]
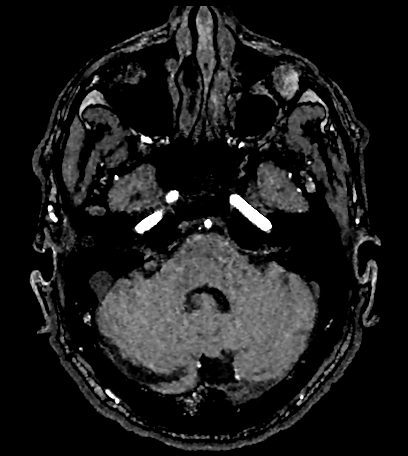
[im 79/217]
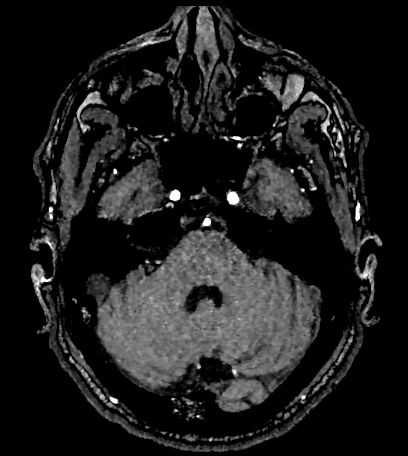
[im 83/217]
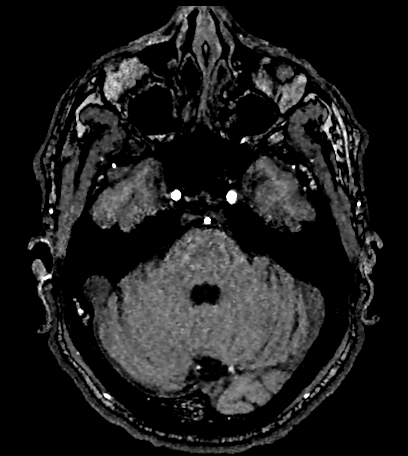
[im 88/217]
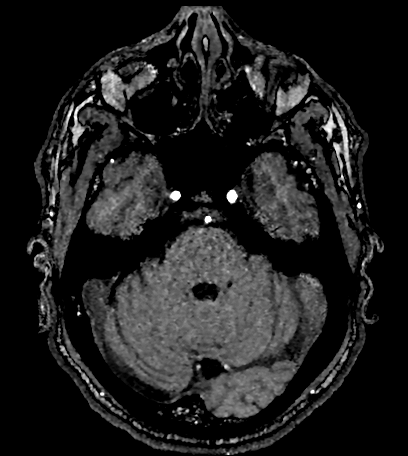
[im 97/217]
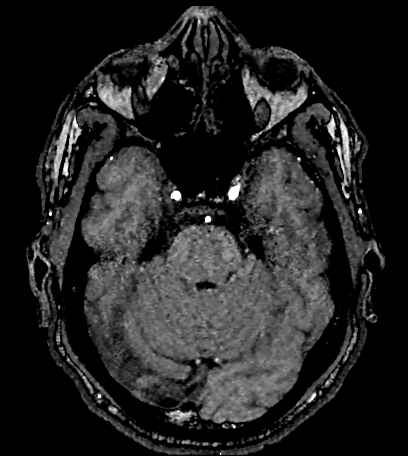
[im 111/217]
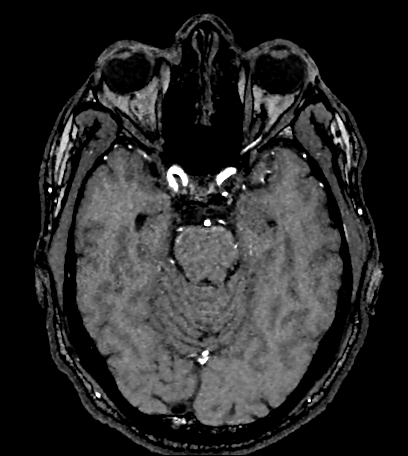
[im 125/217]
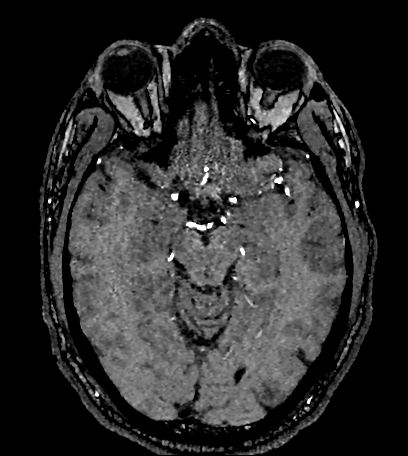
[im 152/217]
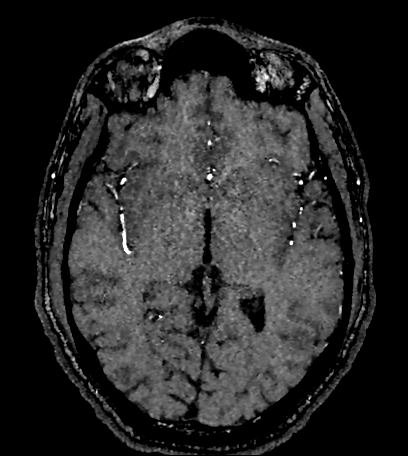
[im 180/217]
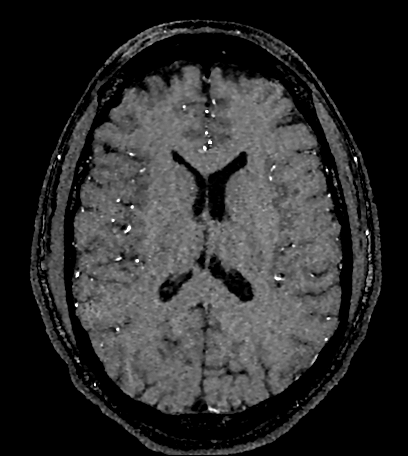
[im 184/217]
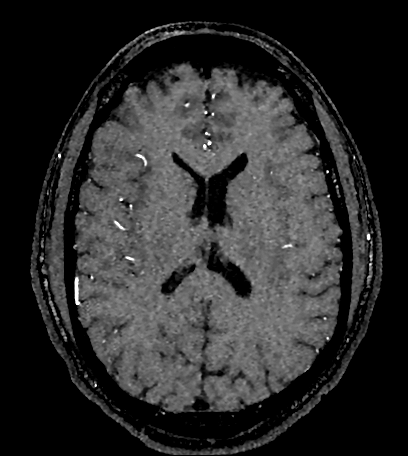
[im 207/217]
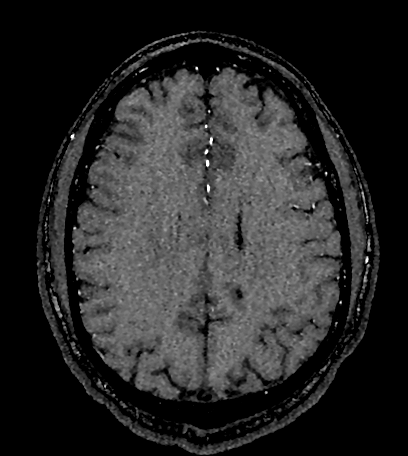

[27 of 48 positions shown; findings below may reference images not displayed]

FINDINGS: MRI HEAD

DWI, T2 FLAIR, and SWI sequences only.

Brain: There is no acute infarction or intracranial hemorrhage.
There is no intracranial mass, mass effect, or edema. There is no
hydrocephalus or extra-axial fluid collection. Ventricles and sulci
are normal in size and configuration.

Vascular: Major vessel flow voids at the skull base are preserved.

Skull and upper cervical spine: Normal marrow signal is preserved.

Sinuses/Orbits: Paranasal sinuses are aerated. Orbits are
unremarkable.

Other: Sella is unremarkable.  Mastoid air cells are clear.

MRA HEAD

Intracranial internal carotid arteries are patent. Middle and
anterior cerebral arteries are patent. Intracranial vertebral
arteries, basilar artery, posterior cerebral arteries are patent.
There is no significant stenosis or aneurysm.
IMPRESSION: No acute infarction or hemorrhage. No large vessel occlusion or
significant stenosis.

## 2021-10-31 MED ORDER — PROCHLORPERAZINE EDISYLATE 10 MG/2ML IJ SOLN
10.0000 mg | Freq: Once | INTRAMUSCULAR | Status: AC
  Administered 2021-10-31: 10 mg via INTRAVENOUS
  Filled 2021-10-31: qty 2

## 2021-10-31 MED ORDER — SODIUM CHLORIDE 0.9% FLUSH
3.0000 mL | Freq: Once | INTRAVENOUS | Status: AC
  Administered 2021-10-31: 3 mL via INTRAVENOUS

## 2021-10-31 MED ORDER — DIPHENHYDRAMINE HCL 50 MG/ML IJ SOLN
12.5000 mg | Freq: Once | INTRAMUSCULAR | Status: AC
  Administered 2021-10-31: 12.5 mg via INTRAVENOUS
  Filled 2021-10-31: qty 1

## 2021-10-31 MED ORDER — KETOROLAC TROMETHAMINE 15 MG/ML IJ SOLN
15.0000 mg | Freq: Once | INTRAMUSCULAR | Status: AC
  Administered 2021-10-31: 15 mg via INTRAVENOUS
  Filled 2021-10-31: qty 1

## 2021-10-31 MED ORDER — SODIUM CHLORIDE 0.9 % IV BOLUS
1000.0000 mL | Freq: Once | INTRAVENOUS | Status: AC
  Administered 2021-10-31: 1000 mL via INTRAVENOUS

## 2021-10-31 MED ORDER — DEXAMETHASONE SODIUM PHOSPHATE 4 MG/ML IJ SOLN
4.0000 mg | Freq: Once | INTRAMUSCULAR | Status: AC
  Administered 2021-10-31: 4 mg via INTRAVENOUS
  Filled 2021-10-31: qty 1

## 2021-10-31 MED ORDER — IOHEXOL 350 MG/ML SOLN
75.0000 mL | Freq: Once | INTRAVENOUS | Status: AC | PRN
  Administered 2021-10-31: 75 mL via INTRAVENOUS

## 2021-10-31 NOTE — ED Notes (Signed)
Pt states that he is feeling better and ready to go home, pt awake and acting appropriately,  pt ambulatory around room.  D/c pt iv, cath intact, pt states that a friend is coming to get him.  Pt ambulatory from dpt

## 2021-10-31 NOTE — Progress Notes (Signed)
CODE STROKE- PHARMACY COMMUNICATION   Time CODE STROKE called/page received:[id number]  Time response to CODE STROKE was made in person or via phone): [phone]  Time Stroke Kit retrieved from [employer]:N/A  Name of Provider/Nurse contacted: Dillon Kaiser  No past medical history on file. Prior to Admission medications   Not on File    Dallie Piles ,PharmD Clinical Pharmacist  10/31/2021  8:01 AM

## 2021-10-31 NOTE — ED Notes (Signed)
Pt in bed, pt opens eyes to verbal stim, pt states that he is feeling better, reports equal sensation to touch, moving all extremities, pt states that he just feels tired
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
## 2021-10-31 NOTE — ED Notes (Signed)
Pt in bed, pt arouses easily, pt called a friend/co worker to come get him.  Annice Pih states that he is coming to pick him up

## 2021-10-31 NOTE — ED Notes (Signed)
Pt in bed with eyes closed, arouses to verbal stim, pt states that he is feeling better; however, pt returns back to pre aroused state and is very sleepy.  Suggested pt call his wife to come pick him up.

## 2021-10-31 NOTE — Discharge Instructions (Signed)
Please return for any further problems.  Please follow-up with neurology either Dr. Sherryll Burger or Dr Malvin Johns.  I have included their contact information.  Please call the office and let them know that you had a complex migraine and could not move your left side.  They should be out of see you fairly quickly.

## 2021-10-31 NOTE — ED Provider Notes (Signed)
[facility] Provider Note    Event Date/Time   First MD Initiated Contact with Patient 10/31/21 [phone]     (approximate)   History   Weakness   HPI  Dillon Kaiser is a 46 y.o. male who is a hypertensive has not been taking his medicines for last 3 days.  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  He can follow commands he has the ability to move the left side but is markedly weaker than the right.  Patient is now going to CT scan fingerstick he was 87.      Physical Exam   Triage Vital Signs: ED Triage Vitals  Enc Vitals Group     BP      Pulse      Resp      Temp      Temp src      SpO2      Weight      Height      Head Circumference      Peak Flow      Pain Score      Pain Loc      Pain Edu?      Excl. in GC?     Most recent vital signs: Vitals:   10/31/21 0945 10/31/21 1000  BP:  (!) 148/106  Pulse: 61 (!) 58  Resp: 19 15  Temp:    SpO2: 98% 98%     General: Awake, alert follows commands CV:  Good peripheral perfusion.  Resp:  Normal effort.  Abd:  No distention.  Neuro: Patient with decreased ability to speak apparently and left arm and leg weakness and possibly numbness.   ED Results / Procedures / Treatments   Labs (all labs ordered are listed, but only abnormal results are displayed) Labs Reviewed  COMPREHENSIVE METABOLIC PANEL - Abnormal; Notable for the following components:      Result Value   Calcium 8.7 (*)    Total Protein 6.4 (*)    AST 13 (*)    All other components within normal limits  PROTIME-INR  APTT  CBC  DIFFERENTIAL  ETHANOL  CBG MONITORING, ED     EKG     RADIOLOGY CT scan CT angio MRI of brain and MR a brain read by radiology reviewed by me are  negative.   PROCEDURES:  Critical Care performed:   Procedures   MEDICATIONS ORDERED IN ED: Medications  sodium chloride flush (NS) 0.9 % injection 3 mL (3 mLs Intravenous Given 10/31/21 0928)  iohexol (OMNIPAQUE) 350 MG/ML injection 75 mL (75 mLs Intravenous Contrast Given 10/31/21 0819)  prochlorperazine (COMPAZINE) injection 10 mg (10 mg Intravenous Given 10/31/21 0921)  ketorolac (TORADOL) 15 MG/ML injection 15 mg (15 mg Intravenous Given 10/31/21 0918)  diphenhydrAMINE (BENADRYL) injection 12.5 mg (12.5 mg Intravenous Given 10/31/21 0916)  dexamethasone (DECADRON) injection 4 mg (4 mg Intravenous Given 10/31/21 0919)  sodium chloride 0.9 % bolus 1,000 mL (0 mLs Intravenous Stopped 10/31/21 1023)     IMPRESSION / MDM / ASSESSMENT AND PLAN / ED COURSE  I reviewed the triage vital signs and the nursing notes. I discussed the patient with neurology Dr. Amada Jupiter.  Dr. Amada Jupiter has reviewed the MR brain and MR angio  head along with the angio head and neck and head CT and feels that patient not having a stroke but possibly complex migraine or reaction to stress.  He advises me to give the patient a migraine cocktail.  We will do this and await the if official reading of all the studies.   Patient's presentation is most consistent with acute complicated illness / injury requiring diagnostic workup.  ----------------------------------------- 10:47 AM on 10/31/2021 ----------------------------------------- Patient appears back to baseline he is awake alert talking moving everything seems to have had a hemiplegic migraine we will discharge him at this point.  I have asked him to follow-up with neurology.  FINAL CLINICAL IMPRESSION(S) / ED DIAGNOSES   Final diagnoses:  Hemiplegic migraine without status migrainosus, not intractable     Rx / DC Orders   ED Discharge Orders     None        Note:  This document was prepared using Dragon voice recognition software and may  include unintentional dictation errors.   Arnaldo Natal, MD 10/31/21 1048

## 2021-10-31 NOTE — Progress Notes (Signed)
Code stroke activated per elert @ [phone], pre alert for EMS. ERP at bedside on pt arrival to assess patient.  Pt to CT 0759, Dr Amada Jupiter paged @ [phone], at bedside in CT to assess.  Pt sent for additional imaging, no TNK per Dr Amada Jupiter at this time, call ended [phone]

## 2021-11-07 DIAGNOSIS — R000 Tachycardia, unspecified: Secondary | ICD-10-CM | POA: Diagnosis not present

## 2021-11-07 DIAGNOSIS — R072 Precordial pain: Secondary | ICD-10-CM | POA: Diagnosis not present

## 2021-11-07 DIAGNOSIS — R079 Chest pain, unspecified: Secondary | ICD-10-CM | POA: Diagnosis not present

## 2021-11-07 DIAGNOSIS — R069 Unspecified abnormalities of breathing: Secondary | ICD-10-CM | POA: Diagnosis not present

## 2021-11-07 DIAGNOSIS — R0902 Hypoxemia: Secondary | ICD-10-CM | POA: Diagnosis not present

## 2021-11-07 DIAGNOSIS — R0789 Other chest pain: Secondary | ICD-10-CM | POA: Diagnosis not present
